# Patient Record
Sex: Male | Born: 1955 | Race: Black or African American | Hispanic: No | Smoking: Current every day smoker
Health system: Southern US, Community
[De-identification: ages and names within clinical notes are randomized; demographics above are authoritative.]

## PROBLEM LIST (undated history)

## (undated) DIAGNOSIS — J449 Chronic obstructive pulmonary disease, unspecified: Secondary | ICD-10-CM

## (undated) DIAGNOSIS — B1920 Unspecified viral hepatitis C without hepatic coma: Secondary | ICD-10-CM

## (undated) DIAGNOSIS — F1910 Other psychoactive substance abuse, uncomplicated: Secondary | ICD-10-CM

## (undated) HISTORY — DX: Chronic obstructive pulmonary disease, unspecified: J44.9

---

## 2006-11-30 ENCOUNTER — Emergency Department (HOSPITAL_COMMUNITY): Admission: EM | Admit: 2006-11-30 | Discharge: 2006-11-30 | Payer: Self-pay | Admitting: Emergency Medicine

## 2015-04-15 ENCOUNTER — Telehealth: Payer: Self-pay | Admitting: *Deleted

## 2015-04-15 NOTE — Telephone Encounter (Signed)
Left patient a voice mail to call the clinic regarding referral for Hep C. He needs a lab appt and then he will be scheduled with Dr. Luciana Axeomer in July. Wendall MolaJacqueline Cockerham

## 2015-05-05 ENCOUNTER — Other Ambulatory Visit: Payer: Self-pay

## 2015-05-26 ENCOUNTER — Other Ambulatory Visit: Payer: No Typology Code available for payment source

## 2015-05-26 DIAGNOSIS — B182 Chronic viral hepatitis C: Secondary | ICD-10-CM

## 2015-05-27 LAB — HEPATITIS B SURFACE ANTIGEN: HEP B S AG: NEGATIVE

## 2015-05-27 LAB — HIV ANTIBODY (ROUTINE TESTING W REFLEX): HIV: NONREACTIVE

## 2015-05-27 LAB — PROTIME-INR
INR: 0.99 (ref <1.50)
PROTHROMBIN TIME: 13.1 s (ref 11.6–15.2)

## 2015-05-27 LAB — HEPATITIS B SURFACE ANTIBODY,QUALITATIVE: HEP B S AB: NEGATIVE

## 2015-06-02 LAB — HEPATITIS C RNA QUANTITATIVE
HCV QUANT LOG: 5.74 {Log} — AB (ref <1.18)
HCV QUANT: 544451 [IU]/mL — AB (ref <15)

## 2015-06-05 LAB — HEPATITIS C GENOTYPE

## 2015-06-30 ENCOUNTER — Ambulatory Visit (INDEPENDENT_AMBULATORY_CARE_PROVIDER_SITE_OTHER): Payer: No Typology Code available for payment source | Admitting: Internal Medicine

## 2015-06-30 ENCOUNTER — Encounter: Payer: Self-pay | Admitting: Internal Medicine

## 2015-06-30 VITALS — BP 122/81 | HR 66 | Temp 97.9°F | Ht 70.0 in | Wt 147.0 lb

## 2015-06-30 DIAGNOSIS — F1910 Other psychoactive substance abuse, uncomplicated: Secondary | ICD-10-CM

## 2015-06-30 DIAGNOSIS — B182 Chronic viral hepatitis C: Secondary | ICD-10-CM | POA: Insufficient documentation

## 2015-06-30 DIAGNOSIS — Z23 Encounter for immunization: Secondary | ICD-10-CM

## 2015-06-30 NOTE — Addendum Note (Signed)
Addended by: Wendall Mola A on: 06/30/2015 05:07 PM   Modules accepted: Orders

## 2015-06-30 NOTE — Progress Notes (Signed)
HPI:  +Stephen Lyons is a 59 y.o. male who presents for initial evaluation and management of chronic hepatitis C.  Patient tested positive tested this ;year during routine screening. Hepatitis C risk factors present are: IV drug abuse (details: remote, over 20 years ago), currently uses cocaine, does not inject. Patient denies history of blood transfusion, multiple sexual partners, renal dialysis, sexual contact with person with liver disease, tattoos. Patient has had other studies performed. Results: hepatitis C RNA by PCR, result: positive. Patient has not had prior treatment for Hepatitis C. Patient does not have a past history of liver disease. Patient does not have a family history of liver disease.  He does struggle with cocaine use and hopeful to get help to get off of drugs.  Does not drink alcohol.     Patient does not have documented immunity to Hepatitis A. Patient does not have documented immunity to Hepatitis B.    Review of Systems:  Constitutional: Negative for fatigue, weight loss.  HENT: Negative for hearing loss, ear pain, neck pain, tinnitus and ear discharge.  Eyes: Negative for icterus, discharge and redness.  Respiratory: Negative fordypsnea, wheezing.  Cardiovascular: Negative for chest pain, palpitations, orthopnea, claudication and leg swelling.  Gastrointestinal: Negative for nausea, vomiting, abdominal distention and abdominal pain.Negative for  Genitourinary: Negative for dysuria, urgency, frequency, hematuria and flank pain.  Musculoskeletal: Negative for arthralgias, arthritis Skin: Negative for itching and rash. Neurological: Negative for dizziness and weakness. Endo/Heme/Allergies: Negative for environmental allergies and polydipsia. Does not bruise/bleed easily.      No past medical history on file.  Prior to Admission medications   Not on File    Allergies  Allergen Reactions  . Aspirin Anaphylaxis    Social History  Substance Use Topics  .  Smoking status: Current Every Day Smoker -- 0.50 packs/day    Types: Cigarettes    Start date: 11/20/1968  . Smokeless tobacco: Never Used  . Alcohol Use: 0.0 oz/week    0 Standard drinks or equivalent per week     Comment: socially    FMHx: no liver disease, no liver cancer   Objective:   Filed Vitals:   06/30/15 1513  BP: 122/81  Pulse: 66  Temp: 97.9 F (36.6 C)   GEN: in no apparent distress and alert HEENT: anicteric Cardiac: Cor RRR and No murmurs Lungs: clear Abdomen: Bowel sounds are normal, liver is not enlarged, spleen is not enlarged Ext: peripheral pulses normal, no pedal edema, no clubbing or cyanosis Skin: negative for - jaundice, spider hemangioma, telangiectasia, palmar erythema, ecchymosis and atrophy Musculoskeletal: no joint swelling  Laboratory Genotype:  Lab Results  Component Value Date   HCVGENOTYPE 1a 05/26/2015   HCV viral load:  Lab Results  Component Value Date   HCVQUANT 829562* 05/26/2015   No results found for: WBC, HGB, HCT, MCV, PLT No results found for: CREATININE, BUN, NA, K, CL, CO2  Lab Results  Component Value Date   INR 0.99 05/26/2015      Assessment: Chronic Hepatitis C genotype 1a I discussed with the patient the natural history and progression of chronic hepatitis C infection including about 30% of people who develop cirrhosis of the liver and once cirrhosis is established there is a 2-7% risk per year of liver cancer and liver failure.    Plan: 1) Patient counseled extensively on limiting acetaminophen to no more than 2 grams daily, avoidance of alcohol. 2) Transmission discussed with patient including sexual transmission, sharing razors and toothbrush.  3) Will need referral to gastroenterology if concern for cirrhosis 4) Will need referral for substance abuse counseling: Yes.   5) Will prescribe Zepatier for 12 weeks once he completes substance abuse counseling and is drug free.  He will need UDS prior to  consideration.   6) Hepatitis A vaccine Yes.   7) Hepatitis B vaccine Yes.   8) Pneumovax vaccine if concern for cirrhosis 9) will follow up after elastography; will do NS5A testing once he has completed substance abuse counseling

## 2015-07-07 ENCOUNTER — Ambulatory Visit: Payer: No Typology Code available for payment source | Admitting: *Deleted

## 2015-07-29 ENCOUNTER — Other Ambulatory Visit: Payer: Self-pay | Admitting: Internal Medicine

## 2015-07-29 ENCOUNTER — Telehealth: Payer: Self-pay | Admitting: *Deleted

## 2015-07-29 ENCOUNTER — Ambulatory Visit (HOSPITAL_COMMUNITY)
Admission: RE | Admit: 2015-07-29 | Discharge: 2015-07-29 | Disposition: A | Discharge status: Home / Self Care | Payer: Self-pay | Source: Ambulatory Visit | Attending: Internal Medicine | Admitting: Internal Medicine

## 2015-07-29 DIAGNOSIS — R160 Hepatomegaly, not elsewhere classified: Secondary | ICD-10-CM

## 2015-07-29 DIAGNOSIS — B182 Chronic viral hepatitis C: Secondary | ICD-10-CM | POA: Insufficient documentation

## 2015-07-29 NOTE — Telephone Encounter (Signed)
Called and left patient a voice mail to call me back regarding his elastography results. Per Dr. Luciana Axe it is recommended that he have a follow up MRI. This has been scheduled for 08/05/15 at 4:00 pm and patient should have nothing to eat or drink for four hours prior to the test. If he has questions Dr. Luciana Axe is available this afternoon to speak to the patient. Wendall Mola

## 2015-07-29 NOTE — Telephone Encounter (Signed)
Left patient a voicemail. Please see detailed note.

## 2015-07-29 NOTE — Telephone Encounter (Signed)
Received call from Erskine Squibb in radiology regarding impression #1 from patient's elastography.  Futher workup with MRI is recommended. Andree Coss, RN

## 2015-08-05 ENCOUNTER — Ambulatory Visit (HOSPITAL_COMMUNITY)
Admission: RE | Admit: 2015-08-05 | Discharge: 2015-08-05 | Disposition: A | Discharge status: Home / Self Care | Payer: Self-pay | Source: Ambulatory Visit | Attending: Internal Medicine | Admitting: Internal Medicine

## 2015-08-05 DIAGNOSIS — K769 Liver disease, unspecified: Secondary | ICD-10-CM | POA: Insufficient documentation

## 2015-08-05 DIAGNOSIS — B1920 Unspecified viral hepatitis C without hepatic coma: Secondary | ICD-10-CM | POA: Insufficient documentation

## 2015-08-05 DIAGNOSIS — R160 Hepatomegaly, not elsewhere classified: Secondary | ICD-10-CM

## 2015-08-05 LAB — CREATININE, SERUM
CREATININE: 1.07 mg/dL (ref 0.61–1.24)
GFR calc Af Amer: >60 mL/min (ref >60)
GFR calc non Af Amer: >60 mL/min (ref >60)

## 2015-08-05 MED ORDER — GADOXETATE DISODIUM 0.25 MMOL/ML IV SOLN
7.0000 mL | Freq: Once | INTRAVENOUS | Status: AC | PRN
  Administered 2015-08-05: 7 mL via INTRAVENOUS

## 2015-08-17 ENCOUNTER — Encounter (HOSPITAL_COMMUNITY): Payer: Self-pay | Admitting: Emergency Medicine

## 2015-08-17 ENCOUNTER — Emergency Department (HOSPITAL_COMMUNITY): Payer: No Typology Code available for payment source

## 2015-08-17 ENCOUNTER — Emergency Department (HOSPITAL_COMMUNITY)
Admission: EM | Admit: 2015-08-17 | Discharge: 2015-08-17 | Disposition: A | Discharge status: Home / Self Care | Payer: Self-pay | Attending: Emergency Medicine | Admitting: Emergency Medicine

## 2015-08-17 DIAGNOSIS — Z8619 Personal history of other infectious and parasitic diseases: Secondary | ICD-10-CM | POA: Insufficient documentation

## 2015-08-17 DIAGNOSIS — T8182XA Emphysema (subcutaneous) resulting from a procedure, initial encounter: Secondary | ICD-10-CM | POA: Insufficient documentation

## 2015-08-17 DIAGNOSIS — Y658 Other specified misadventures during surgical and medical care: Secondary | ICD-10-CM | POA: Insufficient documentation

## 2015-08-17 DIAGNOSIS — J069 Acute upper respiratory infection, unspecified: Secondary | ICD-10-CM | POA: Insufficient documentation

## 2015-08-17 DIAGNOSIS — Z720 Tobacco use: Secondary | ICD-10-CM | POA: Insufficient documentation

## 2015-08-17 HISTORY — DX: Unspecified viral hepatitis C without hepatic coma: B19.20

## 2015-08-17 LAB — CBC WITH DIFFERENTIAL/PLATELET
Basophils Absolute: 0 10*3/uL (ref 0.0–0.1)
Basophils Relative: 0 %
Eosinophils Absolute: 0.2 10*3/uL (ref 0.0–0.7)
Eosinophils Relative: 2 %
HCT: 47.1 % (ref 39.0–52.0)
Hemoglobin: 15.9 g/dL (ref 13.0–17.0)
Lymphocytes Relative: 10 %
Lymphs Abs: 1.1 10*3/uL (ref 0.7–4.0)
MCH: 31.9 pg (ref 26.0–34.0)
MCHC: 33.8 g/dL (ref 30.0–36.0)
MCV: 94.4 fL (ref 78.0–100.0)
Monocytes Absolute: 0.5 10*3/uL (ref 0.1–1.0)
Monocytes Relative: 5 %
Neutro Abs: 8.8 10*3/uL — ABNORMAL HIGH (ref 1.7–7.7)
Neutrophils Relative %: 83 %
Platelets: 236 10*3/uL (ref 150–400)
RBC: 4.99 MIL/uL (ref 4.22–5.81)
RDW: 12.8 % (ref 11.5–15.5)
WBC: 10.5 10*3/uL (ref 4.0–10.5)

## 2015-08-17 LAB — COMPREHENSIVE METABOLIC PANEL
ALT: 26 U/L (ref 17–63)
AST: 25 U/L (ref 15–41)
Albumin: 3.8 g/dL (ref 3.5–5.0)
Alkaline Phosphatase: 81 U/L (ref 38–126)
Anion gap: 8 (ref 5–15)
BUN: 8 mg/dL (ref 6–20)
CO2: 26 mmol/L (ref 22–32)
Calcium: 8.9 mg/dL (ref 8.9–10.3)
Chloride: 102 mmol/L (ref 101–111)
Creatinine, Ser: 0.95 mg/dL (ref 0.61–1.24)
GFR calc Af Amer: >60 mL/min (ref >60)
GFR calc non Af Amer: >60 mL/min (ref >60)
Glucose, Bld: 79 mg/dL (ref 65–99)
Potassium: 4 mmol/L (ref 3.5–5.1)
Sodium: 136 mmol/L (ref 135–145)
Total Bilirubin: 0.6 mg/dL (ref 0.3–1.2)
Total Protein: 6.7 g/dL (ref 6.5–8.1)

## 2015-08-17 MED ORDER — IPRATROPIUM BROMIDE 0.02 % IN SOLN
0.5000 mg | RESPIRATORY_TRACT | Status: AC
  Administered 2015-08-17: 0.5 mg via RESPIRATORY_TRACT
  Filled 2015-08-17: qty 2.5

## 2015-08-17 MED ORDER — ALBUTEROL (5 MG/ML) CONTINUOUS INHALATION SOLN
10.0000 mg/h | INHALATION_SOLUTION | RESPIRATORY_TRACT | Status: AC
  Administered 2015-08-17: 10 mg/h via RESPIRATORY_TRACT
  Filled 2015-08-17: qty 20

## 2015-08-17 MED ORDER — ALBUTEROL SULFATE HFA 108 (90 BASE) MCG/ACT IN AERS
2.0000 | INHALATION_SPRAY | RESPIRATORY_TRACT | Status: DC | PRN
  Administered 2015-08-17: 2 via RESPIRATORY_TRACT
  Filled 2015-08-17: qty 6.7

## 2015-08-17 MED ORDER — GUAIFENESIN ER 1200 MG PO TB12: 1.0000 | ORAL_TABLET | Freq: Two times a day (BID) | ORAL | Status: DC

## 2015-08-17 MED ORDER — DOXYCYCLINE HYCLATE 100 MG PO CAPS: 100.0000 mg | ORAL_CAPSULE | Freq: Two times a day (BID) | ORAL | Status: DC

## 2015-08-17 MED ORDER — PREDNISONE 50 MG PO TABS: 50.0000 mg | ORAL_TABLET | Freq: Every day | ORAL | Status: DC

## 2015-08-17 MED ORDER — AEROCHAMBER PLUS FLO-VU MEDIUM MISC
1.0000 | Freq: Once | Status: AC
  Administered 2015-08-17: 1

## 2015-08-17 NOTE — Discharge Instructions (Signed)
Return here as needed.  Followup with your primary care Dr. increase your fluid intake °

## 2015-08-17 NOTE — ED Provider Notes (Signed)
CSN: [id number]     Arrival date & time 08/17/15  0865 History   First MD Initiated Contact with Patient 08/17/15 [phone]     Chief Complaint  Patient presents with  . Cough  . Shortness of Breath     (Consider location/radiation/quality/duration/timing/severity/associated sxs/prior Treatment) HPI Patient presents to the emergency department with cough and shortness of breath over the last 3 weeks.  Patient states that he has been taking an antibiotic that a friend gave him.  Patient states that he has had clear mucous production.  He states that he currently does smoke half pack a day and has done so for the last 45 years.  Patient states that he does not have any nausea, vomiting, abdominal pain, fever, headache, blurred vision, weakness, dizziness, neck pain, dysuria, incontinence, or syncope.  Patient states that exertion seems to make his wheezing worse Past Medical History  Diagnosis Date  . Hepatitis C    No past surgical history on file. No family history on file. Social History  Substance Use Topics  . Smoking status: Current Every Day Smoker -- 0.50 packs/day    Types: Cigarettes    Start date: 11/20/1968  . Smokeless tobacco: Never Used  . Alcohol Use: 0.0 oz/week    0 Standard drinks or equivalent per week     Comment: socially    Review of Systems All other systems negative except as documented in the HPI. All pertinent positives and negatives as reviewed in the HPI.    Allergies  Aspirin  Home Medications   Prior to Admission medications   Medication Sig Start Date End Date Taking? Authorizing Josip Merolla  penicillin v potassium (VEETID) 500 MG tablet Take 500 mg by mouth 3 (three) times daily.   Yes Historical Latoyia Tecson, MD   BP 109/76 mmHg  Pulse 98  Temp(Src) 98.4 F (36.9 C) (Oral)  Resp 18  Ht  (1.778 m)  Wt 155 lb (70.308 kg)  BMI 22.24 kg/m2  SpO2 98% Physical Exam  Constitutional: He is oriented to person, place, and time. He appears  well-developed and well-nourished. No distress.  HENT:  Head: Normocephalic and atraumatic.  Mouth/Throat: Oropharynx is clear and moist.  Eyes: Pupils are equal, round, and reactive to light.  Neck: Normal range of motion. Neck supple.  Cardiovascular: Normal rate, regular rhythm and normal heart sounds.  Exam reveals no gallop and no friction rub.   No murmur heard. Pulmonary/Chest: Effort normal. No respiratory distress. He has wheezes. He has no rales.  Neurological: He is alert and oriented to person, place, and time. No cranial nerve deficit. He exhibits normal muscle tone. Coordination normal.  Skin: Skin is warm and dry. No erythema.  Psychiatric: He has a normal mood and affect. His behavior is normal.  Nursing note and vitals reviewed.   ED Course  Procedures (including critical care time) Labs Review Labs Reviewed  CBC WITH DIFFERENTIAL/PLATELET - Abnormal; Notable for the following:    Neutro Abs 8.8 (*)    All other components within normal limits  COMPREHENSIVE METABOLIC PANEL    Imaging Review Dg Chest 2 View  08/17/2015   CLINICAL DATA:  Productive cough for 3 weeks.  Smoker.  EXAM: CHEST  2 VIEW  COMPARISON:  None.  FINDINGS: The chest is hyperexpanded with attenuation of the pulmonary vasculature. No consolidative process, pneumothorax or effusion is identified. Heart size is normal. No focal bony abnormality.  IMPRESSION: No acute disease.  Emphysema.   Electronically Signed  By: Drusilla Kanner M.D.   On: 08/17/2015 11:30   I have personally reviewed and evaluated these images and lab results as part of my medical decision-making.    Patient is feeling dramatically better.  His wheezing is vastly improved following an hour-long and treatments during the patient is advised to return here as needed.  He was also placed doxycycline as he was likely has COPD and emphysema.  The patient agrees to the plan and all questions were answered  Charlestine Night,  PA-C 08/24/15 1423  Loren Racer, MD 09/02/15 2258

## 2015-08-17 NOTE — ED Notes (Signed)
59 yom presents to ED with c/o cough and SOB at rest. Onset 3 weeks ago. Patient states he has been taking antibiotics (Penicillin) for 1 week that he got from his "nurse friend" who thought he had pneumonia. Patient states he is coughing up clear secretions and that his cough is worse at night. Denies pain but states his chest is full. Patient is a current every day smoker who smokes 1/2 pack a day and has been smoking for 45 years. Patient states he was dx with Hep C in July but has not started any treatment yet.

## 2015-08-17 NOTE — ED Notes (Signed)
Transported to xray 

## 2015-08-17 NOTE — ED Notes (Signed)
Patient requesting not to have any visitors. Psychiatric nurse notified.

## 2016-05-31 IMAGING — DX DG CHEST 2V
2 series · 2 of 2 positions shown · non-contrast
Comparison: None.

CLINICAL DATA: Productive cough for 3 weeks.  Smoker.

EXAM:
CHEST  2 VIEW

[chest pa]
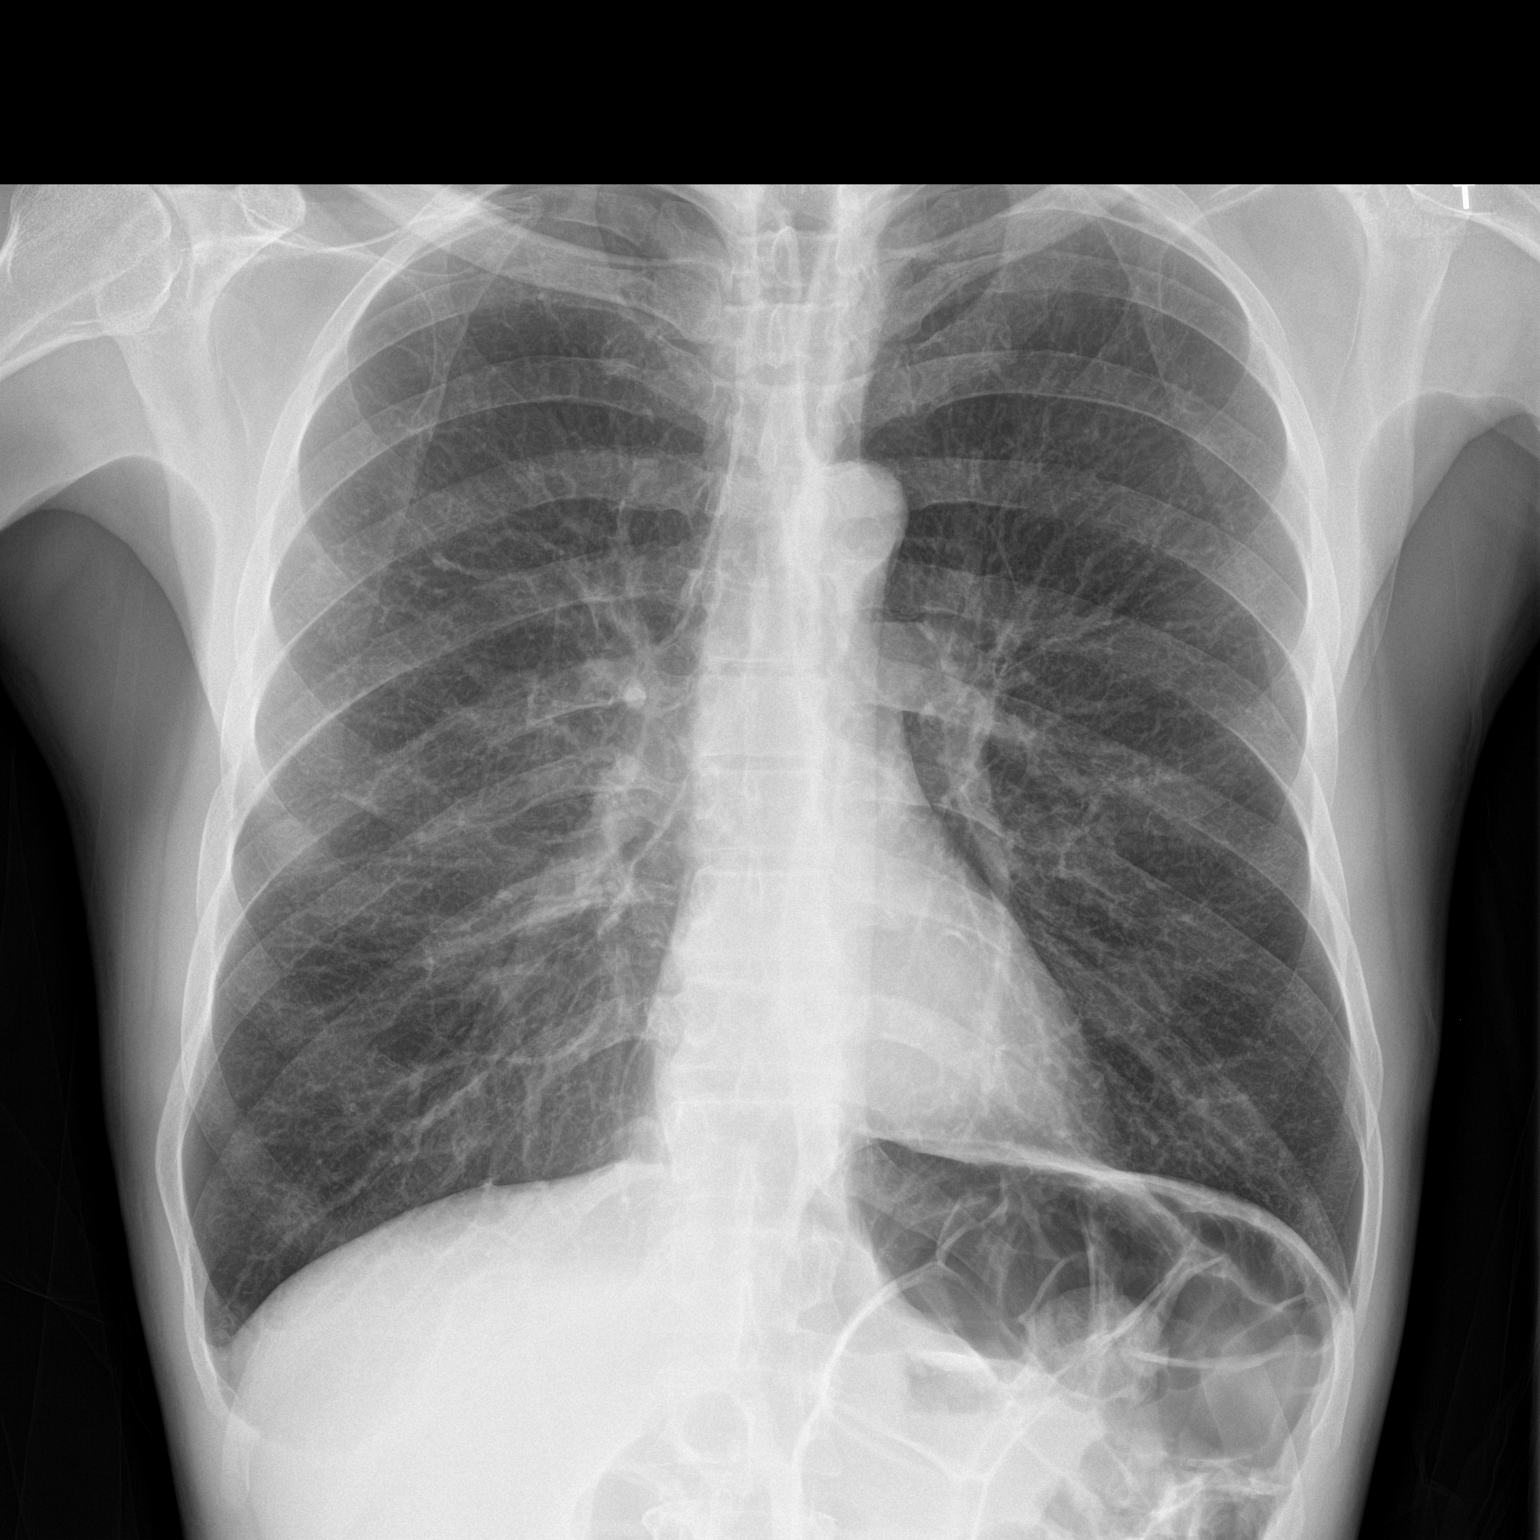

[chest lat]
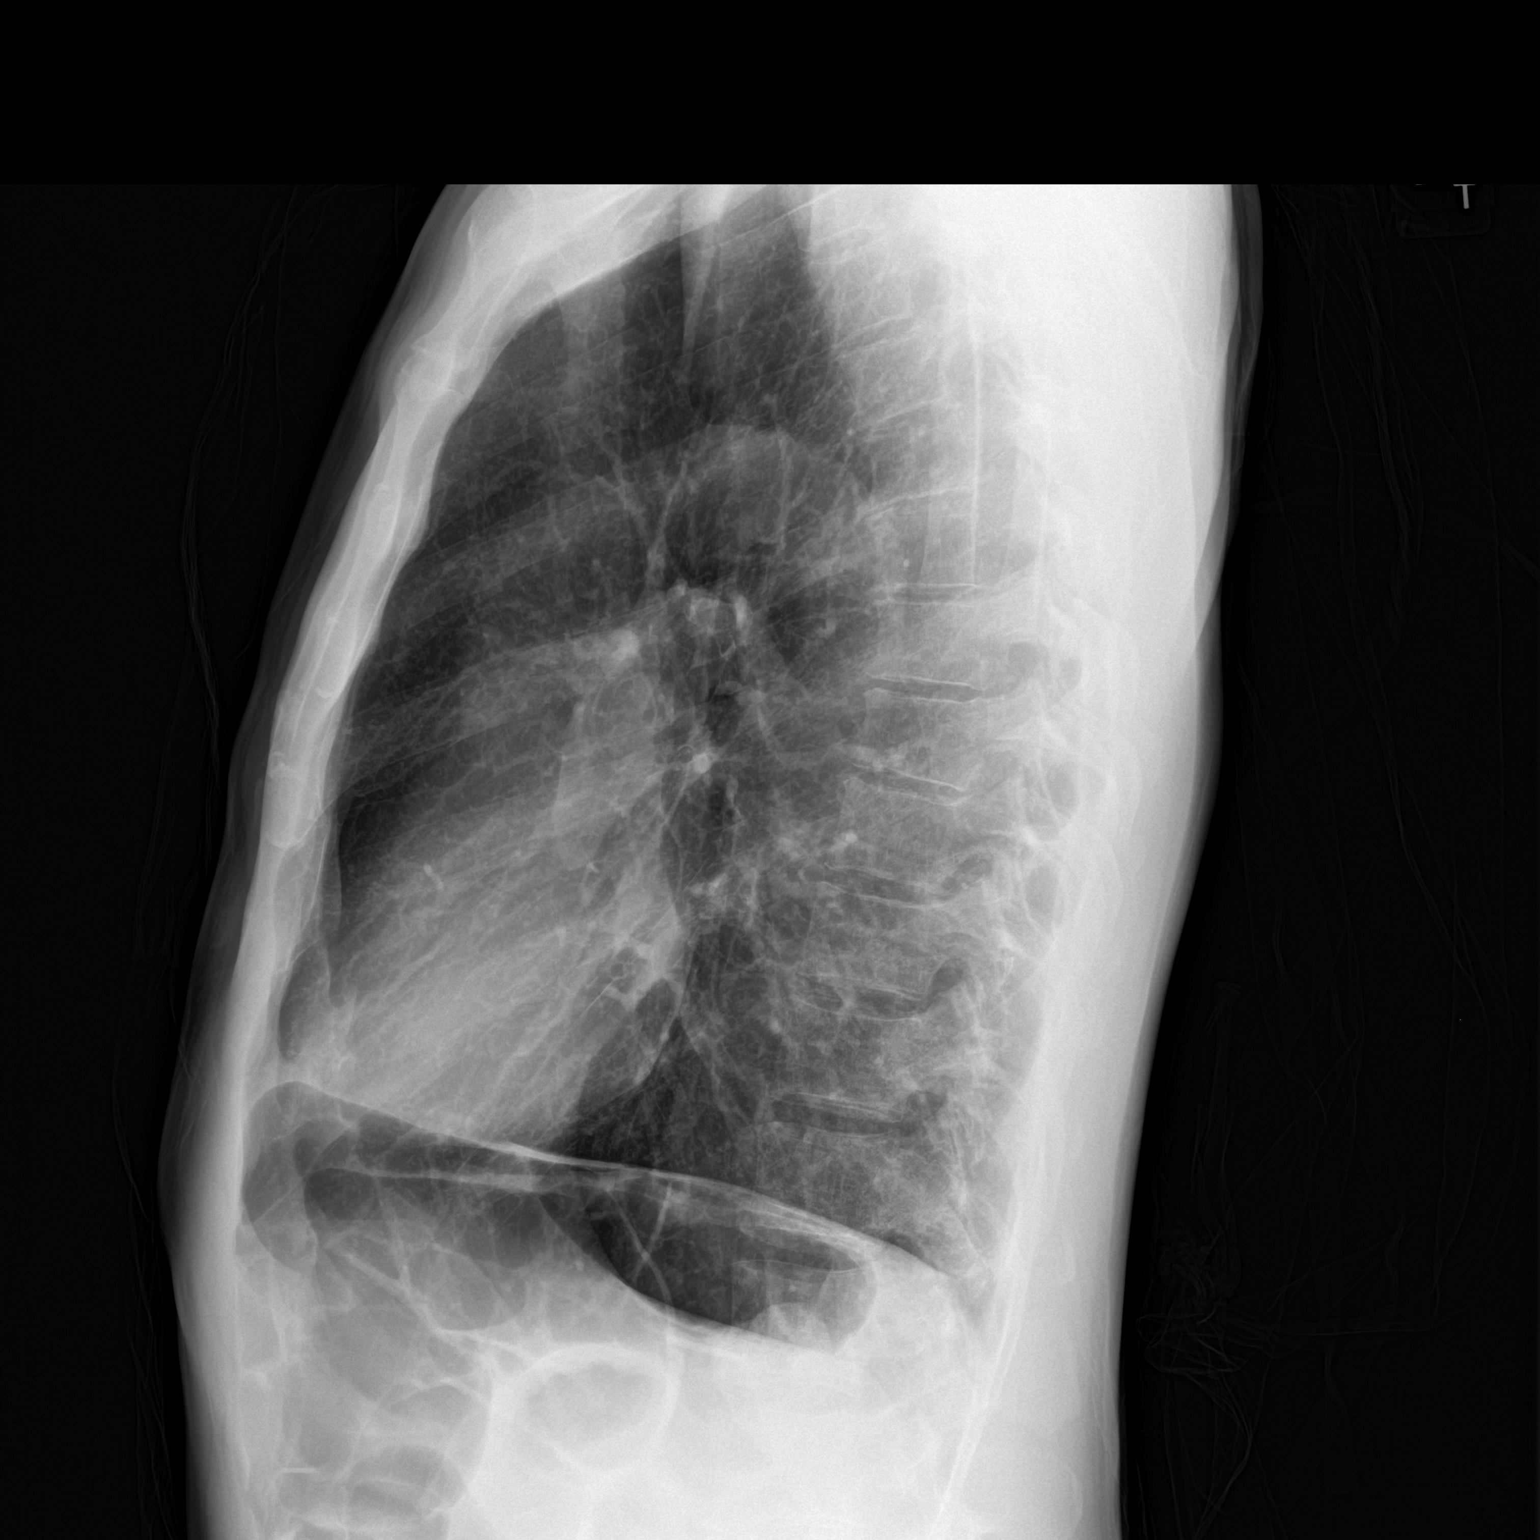

[2 of 2 positions shown; findings below may reference images not displayed]

FINDINGS: The chest is hyperexpanded with attenuation of the pulmonary
vasculature. No consolidative process, pneumothorax or effusion is
identified. Heart size is normal. No focal bony abnormality.
IMPRESSION: No acute disease.

Emphysema.

## 2016-09-18 ENCOUNTER — Encounter (HOSPITAL_COMMUNITY): Payer: Self-pay | Admitting: Emergency Medicine

## 2016-09-18 DIAGNOSIS — F17210 Nicotine dependence, cigarettes, uncomplicated: Secondary | ICD-10-CM | POA: Insufficient documentation

## 2016-09-18 DIAGNOSIS — N452 Orchitis: Secondary | ICD-10-CM | POA: Insufficient documentation

## 2016-09-18 DIAGNOSIS — N410 Acute prostatitis: Secondary | ICD-10-CM | POA: Insufficient documentation

## 2016-09-18 DIAGNOSIS — N451 Epididymitis: Secondary | ICD-10-CM | POA: Insufficient documentation

## 2016-09-18 LAB — URINALYSIS, ROUTINE W REFLEX MICROSCOPIC
BILIRUBIN URINE: NEGATIVE
Glucose, UA: NEGATIVE mg/dL
KETONES UR: NEGATIVE mg/dL
NITRITE: NEGATIVE
PROTEIN: NEGATIVE mg/dL
Specific Gravity, Urine: 1.025 (ref 1.005–1.030)
pH: 6 (ref 5.0–8.0)

## 2016-09-18 LAB — CBC WITH DIFFERENTIAL/PLATELET
BASOS PCT: 0 %
Basophils Absolute: 0 10*3/uL (ref 0.0–0.1)
EOS ABS: 0.1 10*3/uL (ref 0.0–0.7)
EOS PCT: 0 %
HCT: 44.5 % (ref 39.0–52.0)
Hemoglobin: 14.9 g/dL (ref 13.0–17.0)
LYMPHS ABS: 1.8 10*3/uL (ref 0.7–4.0)
Lymphocytes Relative: 7 %
MCH: 31.6 pg (ref 26.0–34.0)
MCHC: 33.5 g/dL (ref 30.0–36.0)
MCV: 94.5 fL (ref 78.0–100.0)
Monocytes Absolute: 1.2 10*3/uL — ABNORMAL HIGH (ref 0.1–1.0)
Monocytes Relative: 5 %
NEUTROS PCT: 88 %
Neutro Abs: 21 10*3/uL — ABNORMAL HIGH (ref 1.7–7.7)
PLATELETS: 345 10*3/uL (ref 150–400)
RBC: 4.71 MIL/uL (ref 4.22–5.81)
RDW: 13 % (ref 11.5–15.5)
WBC: 24 10*3/uL — AB (ref 4.0–10.5)

## 2016-09-18 LAB — URINE MICROSCOPIC-ADD ON
Bacteria, UA: NONE SEEN
Squamous Epithelial / LPF: NONE SEEN

## 2016-09-18 LAB — COMPREHENSIVE METABOLIC PANEL
ALBUMIN: 3.4 g/dL — AB (ref 3.5–5.0)
ALT: 17 U/L (ref 17–63)
ANION GAP: 6 (ref 5–15)
AST: 20 U/L (ref 15–41)
Alkaline Phosphatase: 76 U/L (ref 38–126)
BUN: 8 mg/dL (ref 6–20)
CHLORIDE: 104 mmol/L (ref 101–111)
CO2: 27 mmol/L (ref 22–32)
CREATININE: 0.79 mg/dL (ref 0.61–1.24)
Calcium: 8.7 mg/dL — ABNORMAL LOW (ref 8.9–10.3)
GFR calc non Af Amer: >60 mL/min (ref >60)
GLUCOSE: 99 mg/dL (ref 65–99)
Potassium: 3.5 mmol/L (ref 3.5–5.1)
SODIUM: 137 mmol/L (ref 135–145)
Total Bilirubin: 0.5 mg/dL (ref 0.3–1.2)
Total Protein: 6.9 g/dL (ref 6.5–8.1)

## 2016-09-18 NOTE — ED Triage Notes (Signed)
Pt. reports yellowish penile discharge with right groin/right testicular pain onset last week , pt. added blood in semen.

## 2016-09-19 ENCOUNTER — Emergency Department (HOSPITAL_COMMUNITY): Payer: No Typology Code available for payment source

## 2016-09-19 ENCOUNTER — Emergency Department (HOSPITAL_COMMUNITY)
Admission: EM | Admit: 2016-09-19 | Discharge: 2016-09-19 | Disposition: A | Discharge status: Home / Self Care | Payer: No Typology Code available for payment source | Attending: Emergency Medicine | Admitting: Emergency Medicine

## 2016-09-19 DIAGNOSIS — N50811 Right testicular pain: Secondary | ICD-10-CM

## 2016-09-19 DIAGNOSIS — N410 Acute prostatitis: Secondary | ICD-10-CM

## 2016-09-19 DIAGNOSIS — N453 Epididymo-orchitis: Secondary | ICD-10-CM

## 2016-09-19 LAB — GC/CHLAMYDIA PROBE AMP (~~LOC~~) NOT AT ARMC
CHLAMYDIA, DNA PROBE: NEGATIVE
NEISSERIA GONORRHEA: NEGATIVE

## 2016-09-19 MED ORDER — AZITHROMYCIN 250 MG PO TABS
1000.0000 mg | ORAL_TABLET | Freq: Once | ORAL | Status: AC
  Administered 2016-09-19: 1000 mg via ORAL
  Filled 2016-09-19: qty 4

## 2016-09-19 MED ORDER — CEFTRIAXONE SODIUM 250 MG IJ SOLR
250.0000 mg | Freq: Once | INTRAMUSCULAR | Status: AC
  Administered 2016-09-19: 250 mg via INTRAMUSCULAR
  Filled 2016-09-19: qty 250

## 2016-09-19 MED ORDER — LIDOCAINE HCL (PF) 1 % IJ SOLN
INTRAMUSCULAR | Status: AC
  Administered 2016-09-19: 1 mL
  Filled 2016-09-19: qty 5

## 2016-09-19 MED ORDER — CIPROFLOXACIN HCL 500 MG PO TABS: 500.0000 mg | ORAL_TABLET | Freq: Two times a day (BID) | ORAL | 0 refills | Status: DC

## 2016-09-19 MED ORDER — NAPROXEN 500 MG PO TABS: 500.0000 mg | ORAL_TABLET | Freq: Two times a day (BID) | ORAL | 0 refills | Status: DC

## 2016-09-19 NOTE — Discharge Instructions (Signed)
You were seen today for testicle pain as well as bloody semen. You likely have both orchitis and prostatitis. You are also treated for an tested for STDs. You would be called if this is positive. If you develop fevers or any new or worsening symptoms she needs to be reevaluated.

## 2016-09-19 NOTE — ED Provider Notes (Signed)
[facility]-EMERGENCY DEPT Provider Note   CSN: [id number] Arrival date & time: 09/18/16  1945  By signing my name below, I, Clovis Pu, attest that this documentation has been prepared under the direction and in the presence of Shon Baton, MD  Electronically Signed: Clovis Pu, ED Scribe. 09/19/16. 1:41 AM.  History   Chief Complaint Chief Complaint  Patient presents with  . Penile Discharge  . Testicle Pain   The history is provided by the patient. No language interpreter was used.   HPI Comments:  Stephen Lyons is a 60 y.o. male who presents to the Emergency Department complaining of "2/10" R testicle pain x 4-5 days. Pain is exacerbated to the touch. He notes associated penile discharge with some blood. Also reports bloody ejaculation. Pt denies penile pain, fevers, back pain, abdominal pain or fevers. He also denies any new sexual contacts or a chance of STD. No alleviating factors noted. Pt denies any other symptoms or complaints at this time.  Past Medical History:  Diagnosis Date  . Hepatitis C     Patient Active Problem List   Diagnosis Date Noted  . Liver mass 07/29/2015  . Chronic hepatitis C without hepatic coma (HCC) 06/30/2015  . Substance abuse 06/30/2015    No past surgical history on file.     Home Medications    Prior to Admission medications   Medication Sig Start Date End Date Taking? Authorizing Provider  acetaminophen (TYLENOL) 500 MG tablet Take 1,000 mg by mouth every 6 (six) hours as needed for mild pain.   Yes Historical Provider, MD  ciprofloxacin (CIPRO) 500 MG tablet Take 1 tablet (500 mg total) by mouth every 12 (twelve) hours. 09/19/16   Shon Baton, MD  naproxen (NAPROSYN) 500 MG tablet Take 1 tablet (500 mg total) by mouth 2 (two) times daily. Limit use to 5 days 09/19/16   Shon Baton, MD    Family History No family history on file.  Social History Social History  Substance Use Topics  . Smoking status: Current  Every Day Smoker    Packs/day: 0.00    Types: Cigarettes    Start date: 11/20/1968  . Smokeless tobacco: Never Used  . Alcohol use 0.0 oz/week     Comment: socially     Allergies   Aspirin   Review of Systems Review of Systems  Constitutional: Negative for fever.  Gastrointestinal: Negative for abdominal pain.  Genitourinary: Positive for discharge and testicular pain. Negative for difficulty urinating, dysuria and penile pain.  Musculoskeletal: Negative for back pain.  All other systems reviewed and are negative.    Physical Exam Updated Vital Signs BP 125/85   Pulse 83   Temp 98.3 F (36.8 C)   Resp 17   Ht 5\' 10"  (1.778 m)   Wt 145 lb (65.8 kg)   SpO2 97%   BMI 20.81 kg/m   Physical Exam  Constitutional: He is oriented to person, place, and time. He appears well-developed and well-nourished. No distress.  HENT:  Head: Normocephalic and atraumatic.  Poor dentition  Cardiovascular: Normal rate, regular rhythm and normal heart sounds.   No murmur heard. Pulmonary/Chest: Effort normal and breath sounds normal. No respiratory distress. He has no wheezes.  Abdominal: Soft. Bowel sounds are normal. There is no tenderness. There is no rebound.  Genitourinary:  Genitourinary Comments: Uncircumcised penis, no lesions noted, tenderness to palpation of right testicle, enlarged, no masses noted, no crepitus or overlying skin changes, no inguinal masses or  lymphadenopathy, no discharge noted  Musculoskeletal: He exhibits no edema.  Neurological: He is alert and oriented to person, place, and time.  Skin: Skin is warm and dry.  Psychiatric: He has a normal mood and affect.  Nursing note and vitals reviewed.    ED Treatments / Results  DIAGNOSTIC STUDIES:  Oxygen Saturation is 99% on RA, normal by my interpretation.    COORDINATION OF CARE:  1:24 AM Discussed treatment plan with pt at bedside and pt agreed to plan.  Labs (all labs ordered are listed, but only  abnormal results are displayed) Labs Reviewed  URINALYSIS, ROUTINE W REFLEX MICROSCOPIC (NOT [facility]) - Abnormal; Notable for the following:       Result Value   APPearance CLOUDY (*)    Hgb urine dipstick SMALL (*)    Leukocytes, UA MODERATE (*)    All other components within normal limits  CBC WITH DIFFERENTIAL/PLATELET - Abnormal; Notable for the following:    WBC 24.0 (*)    Neutro Abs 21.0 (*)    Monocytes Absolute 1.2 (*)    All other components within normal limits  COMPREHENSIVE METABOLIC PANEL - Abnormal; Notable for the following:    Calcium 8.7 (*)    Albumin 3.4 (*)    All other components within normal limits  URINE MICROSCOPIC-ADD ON  GC/CHLAMYDIA PROBE AMP ([place]) NOT AT [facility]    EKG  EKG Interpretation None       Radiology [place] Scrotum  Result Date: 09/19/2016 CLINICAL DATA:  Acute onset of right testicular pain. Initial encounter. EXAM: SCROTAL ULTRASOUND DOPPLER ULTRASOUND OF THE TESTICLES TECHNIQUE: Complete ultrasound examination of the testicles, epididymis, and other scrotal structures was performed. Color and spectral Doppler ultrasound were also utilized to evaluate blood flow to the testicles. COMPARISON:  None. FINDINGS: Right testicle Measurements: 3.8 x 2.9 x 3.1 cm. No mass or microlithiasis visualized. There is mild heterogeneity of right testicular echogenicity. Left testicle Measurements: 4.2 x 2.7 x 3.8 cm. No mass or microlithiasis visualized. Right epididymis: Diffusely enlarged and hyperemic, with a complex 0.9 cm cyst at the epididymal tail. Left epididymis:  Normal in size and appearance. Hydrocele:  None visualized. Varicocele:  None visualized. Pulsed Doppler interrogation of both testes demonstrates normal low resistance arterial and venous waveforms bilaterally, with significant hyperemia at the right testis. IMPRESSION: 1. No evidence of testicular torsion. 2. Significant hyperemia at the right testis and right epididymis, with enlargement  of the right epididymis, compatible with right-sided orchitis and epididymitis. 3. Complex 0.9 cm cyst noted at the right epididymal tail. Electronically Signed   By: Roanna RaiderJeffery  Chang M.D.   On: 09/19/2016 02:26   [place] Art/ven Flow Abd Pelv Doppler  Result Date: 09/19/2016 CLINICAL DATA:  Acute onset of right testicular pain. Initial encounter. EXAM: SCROTAL ULTRASOUND DOPPLER ULTRASOUND OF THE TESTICLES TECHNIQUE: Complete ultrasound examination of the testicles, epididymis, and other scrotal structures was performed. Color and spectral Doppler ultrasound were also utilized to evaluate blood flow to the testicles. COMPARISON:  None. FINDINGS: Right testicle Measurements: 3.8 x 2.9 x 3.1 cm. No mass or microlithiasis visualized. There is mild heterogeneity of right testicular echogenicity. Left testicle Measurements: 4.2 x 2.7 x 3.8 cm. No mass or microlithiasis visualized. Right epididymis: Diffusely enlarged and hyperemic, with a complex 0.9 cm cyst at the epididymal tail. Left epididymis:  Normal in size and appearance. Hydrocele:  None visualized. Varicocele:  None visualized. Pulsed Doppler interrogation of both testes demonstrates normal low resistance arterial and venous  waveforms bilaterally, with significant hyperemia at the right testis. IMPRESSION: 1. No evidence of testicular torsion. 2. Significant hyperemia at the right testis and right epididymis, with enlargement of the right epididymis, compatible with right-sided orchitis and epididymitis. 3. Complex 0.9 cm cyst noted at the right epididymal tail. Electronically Signed   By: Roanna RaiderJeffery  Chang M.D.   On: 09/19/2016 02:26    Procedures Procedures (including critical care time)  Medications Ordered in ED Medications  cefTRIAXone (ROCEPHIN) injection 250 mg (250 mg Intramuscular Given 09/19/16 0318)  azithromycin (ZITHROMAX) tablet 1,000 mg (1,000 mg Oral Given 09/19/16 0317)  lidocaine (PF) (XYLOCAINE) 1 % injection (1 mL  Given 09/19/16 0318)      Initial Impression / Assessment and Plan / ED Course  I have reviewed the triage vital signs and the nursing notes.  Pertinent labs & imaging results that were available during my care of the patient were reviewed by me and considered in my medical decision making (see chart for details).  Clinical Course    Patient presents with penile discharge and bloody semen. Denies infectious symptoms. Nontoxic on exam. He has a tender enlarged right testicle. No signs or symptoms of 4 days. Patient was tested and treated for STDs. Ultrasound of the right testicle is negative for torsion but do show evidence of orchitis and epididymitis. Suspect clinically he also has prostatitis. Lab work is notable for too numerous to count white cells in his urine. Culture sent.  White count is 24. He is nontoxic-appearing. No signs or symptoms of sepsis. No back pain indicative of pollen of nephritis. Suspect prostatitis. Will discharge on Cipro for 1 week. He was given strict return precautions.  After history, exam, and medical workup I feel the patient has been appropriately medically screened and is safe for discharge home. Pertinent diagnoses were discussed with the patient. Patient was given return precautions.   Final Clinical Impressions(s) / ED Diagnoses   Final diagnoses:  Right testicular pain  Orchitis and epididymitis  Acute prostatitis    New Prescriptions New Prescriptions   CIPROFLOXACIN (CIPRO) 500 MG TABLET    Take 1 tablet (500 mg total) by mouth every 12 (twelve) hours.   NAPROXEN (NAPROSYN) 500 MG TABLET    Take 1 tablet (500 mg total) by mouth 2 (two) times daily. Limit use to 5 days   I personally performed the services described in this documentation, which was scribed in my presence. The recorded information has been reviewed and is accurate.     Shon Batonourtney F Niguel Moure, MD 09/19/16 [phone]

## 2016-09-20 LAB — URINE CULTURE: CULTURE: NO GROWTH

## 2017-08-17 ENCOUNTER — Emergency Department (HOSPITAL_COMMUNITY)
Admission: EM | Admit: 2017-08-17 | Discharge: 2017-08-17 | Disposition: A | Discharge status: Home / Self Care | Payer: No Typology Code available for payment source | Attending: Emergency Medicine | Admitting: Emergency Medicine

## 2017-08-17 ENCOUNTER — Encounter (HOSPITAL_COMMUNITY): Payer: Self-pay | Admitting: Neurology

## 2017-08-17 ENCOUNTER — Emergency Department (HOSPITAL_COMMUNITY): Payer: No Typology Code available for payment source

## 2017-08-17 DIAGNOSIS — F17210 Nicotine dependence, cigarettes, uncomplicated: Secondary | ICD-10-CM | POA: Insufficient documentation

## 2017-08-17 DIAGNOSIS — Z79899 Other long term (current) drug therapy: Secondary | ICD-10-CM | POA: Insufficient documentation

## 2017-08-17 DIAGNOSIS — J189 Pneumonia, unspecified organism: Secondary | ICD-10-CM | POA: Insufficient documentation

## 2017-08-17 LAB — CBC
HCT: 48.8 % (ref 39.0–52.0)
HEMOGLOBIN: 16.7 g/dL (ref 13.0–17.0)
MCH: 32.6 pg (ref 26.0–34.0)
MCHC: 34.2 g/dL (ref 30.0–36.0)
MCV: 95.1 fL (ref 78.0–100.0)
PLATELETS: 267 10*3/uL (ref 150–400)
RBC: 5.13 MIL/uL (ref 4.22–5.81)
RDW: 13.3 % (ref 11.5–15.5)
WBC: 26.5 10*3/uL — ABNORMAL HIGH (ref 4.0–10.5)

## 2017-08-17 LAB — BASIC METABOLIC PANEL
Anion gap: 8 (ref 5–15)
BUN: 8 mg/dL (ref 6–20)
CO2: 27 mmol/L (ref 22–32)
Calcium: 9 mg/dL (ref 8.9–10.3)
Chloride: 100 mmol/L — ABNORMAL LOW (ref 101–111)
Creatinine, Ser: 1.08 mg/dL (ref 0.61–1.24)
GFR calc Af Amer: >60 mL/min (ref >60)
Glucose, Bld: 119 mg/dL — ABNORMAL HIGH (ref 65–99)
POTASSIUM: 4.3 mmol/L (ref 3.5–5.1)
Sodium: 135 mmol/L (ref 135–145)

## 2017-08-17 MED ORDER — LEVOFLOXACIN IN D5W 750 MG/150ML IV SOLN
750.0000 mg | Freq: Once | INTRAVENOUS | Status: AC
  Administered 2017-08-17: 750 mg via INTRAVENOUS
  Filled 2017-08-17 (×2): qty 150

## 2017-08-17 MED ORDER — LEVOFLOXACIN 500 MG PO TABS: 500.0000 mg | ORAL_TABLET | Freq: Every day | ORAL | 0 refills | Status: DC

## 2017-08-17 MED ORDER — SODIUM CHLORIDE 0.9 % IV BOLUS (SEPSIS)
1000.0000 mL | Freq: Once | INTRAVENOUS | Status: AC
  Administered 2017-08-17: 1000 mL via INTRAVENOUS

## 2017-08-17 NOTE — Discharge Instructions (Signed)
You have a pneumonia, infection of the lung. Start the antibiotics.  Please return to the ER if your symptoms worsen; you have worsening fevers, chills, shortness of breath, inability to keep any medications down. Otherwise see your outpatient doctor in 1 week.

## 2017-08-17 NOTE — ED Triage Notes (Signed)
Pt reports cough, x several weeks, productive clear sputum. Chills started last night. Reports no appetite, generalized weakness, tired all the time. Feels nauseated, no v/d. Is a x 4. Reports soreness all over. Wearing mask.

## 2017-08-17 NOTE — ED Notes (Signed)
Ambulated pt in the hallway pt ambulated well no complaints noted at this time o2 sats 96% HR-118

## 2017-08-17 NOTE — ED Provider Notes (Signed)
[facility]-EMERGENCY DEPT Provider Note   CSN: [id number] Arrival date & time: 08/17/17  1222     History   Chief Complaint Chief Complaint  Patient presents with  . Cough  . Chills    HPI Stephen Lyons is a 61 y.o. male.  HPI  Pt comes in with cc of cough, fevers, and chills. Pt started coughing 3 weeks ago. Overtime, he has noted fevers and now he is having chills - so he decided to come to the Er. The cough produces white phlegm. Pt has no chest pain and he denies any shortness of breath. Pt is feeling malaise and weak. Pt has no medical hx. He smoked 1/2 ppd.  Past Medical History:  Diagnosis Date  . Hepatitis C     Patient Active Problem List   Diagnosis Date Noted  . Liver mass 07/29/2015  . Chronic hepatitis C without hepatic coma (HCC) 06/30/2015  . Substance abuse 06/30/2015    History reviewed. No pertinent surgical history.     Home Medications    Prior to Admission medications   Medication Sig Start Date End Date Taking? Authorizing Provider  ciprofloxacin (CIPRO) 500 MG tablet Take 1 tablet (500 mg total) by mouth every 12 (twelve) hours. Patient not taking: Reported on 08/17/2017 09/19/16   Horton, Mayer Masker, MD  levofloxacin (LEVAQUIN) 500 MG tablet Take 1 tablet (500 mg total) by mouth daily. 08/18/17   Derwood Kaplan, MD  naproxen (NAPROSYN) 500 MG tablet Take 1 tablet (500 mg total) by mouth 2 (two) times daily. Limit use to 5 days Patient not taking: Reported on 08/17/2017 09/19/16   Horton, Mayer Masker, MD    Family History No family history on file.  Social History Social History  Substance Use Topics  . Smoking status: Current Every Day Smoker    Packs/day: 0.00    Types: Cigarettes    Start date: 11/20/1968  . Smokeless tobacco: Never Used  . Alcohol use 0.0 oz/week     Comment: socially     Allergies   Aspirin   Review of Systems Review of Systems  Constitutional: Positive for activity change and fever.  Respiratory: Negative  for shortness of breath.   Cardiovascular: Negative for chest pain.  Hematological: Does not bruise/bleed easily.  All other systems reviewed and are negative.    Physical Exam Updated Vital Signs BP 119/74   Pulse 92   Temp 99 F (37.2 C) (Oral)   Resp (!) 25   Ht  (1.803 m)   SpO2 95%   Physical Exam  Constitutional: He is oriented to person, place, and time. He appears well-developed.  HENT:  Head: Normocephalic and atraumatic.  Eyes: Pupils are equal, round, and reactive to light. Conjunctivae and EOM are normal.  Neck: Normal range of motion. Neck supple.  Cardiovascular: Normal rate and regular rhythm.   Pulmonary/Chest: Effort normal and breath sounds normal. He has no wheezes.  Abdominal: Soft. Bowel sounds are normal. He exhibits no distension. There is no tenderness.  Musculoskeletal: He exhibits no deformity.  Neurological: He is alert and oriented to person, place, and time.  Skin: Skin is warm.  Nursing note and vitals reviewed.    ED Treatments / Results  Labs (all labs ordered are listed, but only abnormal results are displayed) Labs Reviewed  BASIC METABOLIC PANEL - Abnormal; Notable for the following:       Result Value   Chloride 100 (*)    Glucose, Bld 119 (*)  All other components within normal limits  CBC - Abnormal; Notable for the following:    WBC 26.5 (*)    All other components within normal limits  URINALYSIS, ROUTINE W REFLEX MICROSCOPIC    EKG  EKG Interpretation None       Radiology Dg Chest 2 View  Result Date: 08/17/2017 CLINICAL DATA:  Shortness of breath with cough and chills. EXAM: CHEST  2 VIEW COMPARISON:  08/17/2015 FINDINGS: In the lateral the lateral projection is a small opacity over the heart apex, suspected bronchopneumonia in this setting. Hyperinflation. Normal heart size and mediastinal contours. IMPRESSION: 1. Findings of bronchopneumonia in the lateral projection. Followup PA and lateral chest X-ray is  recommended in 3-4 weeks following trial of antibiotic therapy to ensure resolution. 2. Large lung volumes, question COPD in this smoker. Electronically Signed   By: Marnee Spring M.D.   On: 08/17/2017 13:32    Procedures Procedures (including critical care time)  Medications Ordered in ED Medications  levofloxacin (LEVAQUIN) IVPB 750 mg (750 mg Intravenous New Bag/Given 08/17/17 1841)  sodium chloride 0.9 % bolus 1,000 mL (1,000 mLs Intravenous New Bag/Given 08/17/17 1812)     Initial Impression / Assessment and Plan / ED Course  I have reviewed the triage vital signs and the nursing notes.  Pertinent labs & imaging results that were available during my care of the patient were reviewed by me and considered in my medical decision making (see chart for details).    Pt comes in with cc of cough, fevers, chills. Pt has a CAP. CURB 65 score is 0.  Pt was ambulated and had no hypoxia and he has no shortness of breath. Pt is tachycardic.  Final Clinical Impressions(s) / ED Diagnoses   Final diagnoses:  Community acquired pneumonia, unspecified laterality    New Prescriptions New Prescriptions   LEVOFLOXACIN (LEVAQUIN) 500 MG TABLET    Take 1 tablet (500 mg total) by mouth daily.     Derwood Kaplan, MD 08/17/17 2001

## 2017-08-17 NOTE — ED Notes (Signed)
Pt. Stated that he did not come in with a fever, although it had been on and off prior to coming to the emergency dept. Stated that he did not take any medication (that may have reduced a fever) prior to coming here. Temp noted to be 102.1 at 1550.

## 2018-04-07 ENCOUNTER — Emergency Department (HOSPITAL_COMMUNITY): Payer: No Typology Code available for payment source

## 2018-04-07 ENCOUNTER — Encounter (HOSPITAL_COMMUNITY): Payer: Self-pay | Admitting: Emergency Medicine

## 2018-04-07 ENCOUNTER — Emergency Department (HOSPITAL_COMMUNITY)
Admission: EM | Admit: 2018-04-07 | Discharge: 2018-04-07 | Disposition: A | Discharge status: Home / Self Care | Payer: No Typology Code available for payment source | Attending: Emergency Medicine | Admitting: Emergency Medicine

## 2018-04-07 ENCOUNTER — Other Ambulatory Visit: Payer: Self-pay

## 2018-04-07 DIAGNOSIS — Y999 Unspecified external cause status: Secondary | ICD-10-CM | POA: Insufficient documentation

## 2018-04-07 DIAGNOSIS — Y939 Activity, unspecified: Secondary | ICD-10-CM | POA: Insufficient documentation

## 2018-04-07 DIAGNOSIS — S43102A Unspecified dislocation of left acromioclavicular joint, initial encounter: Secondary | ICD-10-CM | POA: Insufficient documentation

## 2018-04-07 DIAGNOSIS — S01112A Laceration without foreign body of left eyelid and periocular area, initial encounter: Secondary | ICD-10-CM | POA: Insufficient documentation

## 2018-04-07 DIAGNOSIS — M25512 Pain in left shoulder: Secondary | ICD-10-CM | POA: Insufficient documentation

## 2018-04-07 DIAGNOSIS — Y929 Unspecified place or not applicable: Secondary | ICD-10-CM | POA: Insufficient documentation

## 2018-04-07 DIAGNOSIS — S0181XA Laceration without foreign body of other part of head, initial encounter: Secondary | ICD-10-CM

## 2018-04-07 DIAGNOSIS — F17210 Nicotine dependence, cigarettes, uncomplicated: Secondary | ICD-10-CM | POA: Insufficient documentation

## 2018-04-07 DIAGNOSIS — S0990XA Unspecified injury of head, initial encounter: Secondary | ICD-10-CM

## 2018-04-07 HISTORY — DX: Other psychoactive substance abuse, uncomplicated: F19.10

## 2018-04-07 LAB — ETHANOL: Alcohol, Ethyl (B): <10 mg/dL (ref <10)

## 2018-04-07 LAB — CBC WITH DIFFERENTIAL/PLATELET
Abs Immature Granulocytes: 0.1 10*3/uL (ref 0.0–0.1)
Basophils Absolute: 0 10*3/uL (ref 0.0–0.1)
Basophils Relative: 0 %
EOS ABS: 0.2 10*3/uL (ref 0.0–0.7)
Eosinophils Relative: 1 %
HEMATOCRIT: 47.2 % (ref 39.0–52.0)
Hemoglobin: 15.4 g/dL (ref 13.0–17.0)
Immature Granulocytes: 1 %
Lymphocytes Relative: 5 %
Lymphs Abs: 0.9 10*3/uL (ref 0.7–4.0)
MCH: 30.6 pg (ref 26.0–34.0)
MCHC: 32.6 g/dL (ref 30.0–36.0)
MCV: 93.8 fL (ref 78.0–100.0)
Monocytes Absolute: 1.2 10*3/uL — ABNORMAL HIGH (ref 0.1–1.0)
Monocytes Relative: 6 %
NEUTROS ABS: 16.4 10*3/uL — AB (ref 1.7–7.7)
NEUTROS PCT: 87 %
PLATELETS: 290 10*3/uL (ref 150–400)
RBC: 5.03 MIL/uL (ref 4.22–5.81)
RDW: 12.8 % (ref 11.5–15.5)
WBC: 18.7 10*3/uL — AB (ref 4.0–10.5)

## 2018-04-07 LAB — COMPREHENSIVE METABOLIC PANEL
ALK PHOS: 85 U/L (ref 38–126)
ALT: 23 U/L (ref 17–63)
ANION GAP: 10 (ref 5–15)
AST: 32 U/L (ref 15–41)
Albumin: 3.9 g/dL (ref 3.5–5.0)
BILIRUBIN TOTAL: 1.3 mg/dL — AB (ref 0.3–1.2)
BUN: 9 mg/dL (ref 6–20)
CALCIUM: 8.6 mg/dL — AB (ref 8.9–10.3)
CO2: 25 mmol/L (ref 22–32)
CREATININE: 1.27 mg/dL — AB (ref 0.61–1.24)
Chloride: 104 mmol/L (ref 101–111)
GFR, EST NON AFRICAN AMERICAN: 58 mL/min — AB (ref >60)
Glucose, Bld: 101 mg/dL — ABNORMAL HIGH (ref 65–99)
Potassium: 3.3 mmol/L — ABNORMAL LOW (ref 3.5–5.1)
SODIUM: 139 mmol/L (ref 135–145)
TOTAL PROTEIN: 7.3 g/dL (ref 6.5–8.1)

## 2018-04-07 LAB — URINALYSIS, ROUTINE W REFLEX MICROSCOPIC
Bacteria, UA: NONE SEEN
Bilirubin Urine: NEGATIVE
Glucose, UA: NEGATIVE mg/dL
KETONES UR: 5 mg/dL — AB
LEUKOCYTES UA: NEGATIVE
Nitrite: NEGATIVE
PROTEIN: NEGATIVE mg/dL
Specific Gravity, Urine: 1.006 (ref 1.005–1.030)
pH: 6 (ref 5.0–8.0)

## 2018-04-07 LAB — RAPID URINE DRUG SCREEN, HOSP PERFORMED
Amphetamines: NOT DETECTED
Barbiturates: NOT DETECTED
Benzodiazepines: NOT DETECTED
Cocaine: POSITIVE — AB
OPIATES: NOT DETECTED
TETRAHYDROCANNABINOL: NOT DETECTED

## 2018-04-07 MED ORDER — POTASSIUM CHLORIDE CRYS ER 20 MEQ PO TBCR
40.0000 meq | EXTENDED_RELEASE_TABLET | Freq: Once | ORAL | Status: AC
  Administered 2018-04-07: 40 meq via ORAL
  Filled 2018-04-07: qty 2

## 2018-04-07 MED ORDER — SODIUM CHLORIDE 0.9 % IV BOLUS
1000.0000 mL | Freq: Once | INTRAVENOUS | Status: AC
  Administered 2018-04-07: 1000 mL via INTRAVENOUS
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
## 2018-04-07 NOTE — ED Notes (Signed)
Spoke with ortho tech in reference to sling application

## 2018-04-07 NOTE — Progress Notes (Signed)
Orthopedic Tech Progress Note Patient Details:  Stephen Lyons 1956/09/05 [id number]  Ortho Devices Type of Ortho Device: Arm sling Ortho Device/Splint Interventions: Application   Post Interventions Patient Tolerated: Well Instructions Provided: Care of device   Saul Fordyce 04/07/2018, 12:21 PM

## 2018-04-07 NOTE — ED Notes (Signed)
Pt informed of need for urine specimen - urinal left at bedside
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
## 2018-04-07 NOTE — ED Notes (Signed)
Resting quietly on stretcher with eyes closed - easily arousable; speaking in low monotone voice; interm humming; c-collar remains intact; warm blankets applied for comfort

## 2018-04-07 NOTE — Discharge Instructions (Addendum)
You have been seen today for a shoulder injury. There was evidence of what is known as an AC separation. Pain: Take 600 mg of ibuprofen every 6 hours or 440 mg (over the counter dose) to 500 mg (prescription dose) of naproxen every 12 hours for the next 3 days. After this time, these medications may be used as needed for pain. Take these medications with food to avoid upset stomach. Choose only one of these medications, do not take them together.  Tylenol: Should you continue to have additional pain while taking the ibuprofen or naproxen, you may add in tylenol as needed. Your daily total maximum amount of tylenol from all sources should be limited to /day for persons without liver problems, or /day for those with liver problems. Ice: May apply ice to the area over the next 24 hours for 15 minutes at a time to reduce swelling. Elevation: Keep the extremity elevated as often as possible to reduce pain and inflammation. Support: Wear the sling for support and comfort. Wear this until pain resolves.  Exercises: Start by performing these exercises a few times a week, increasing the frequency until you are performing them twice daily.  Follow up: Follow-up with the orthopedic specialist on this matter.    Head Injury You have been seen today for a head injury. It does not appear to be serious at this time.  Close observation: The close observation period is usually 6 hours from the injury. This includes staying awake and having a trustworthy adult monitor you to assure your condition does not worsen. You should be in regular contact with this person and ideally, they should be able to monitor you in person.  Secondary observation: The secondary observation period is usually 24 hours from the injury. You are allowed to sleep during this time. A trustworthy adult should intermittently monitor you to assure your condition does not worsen.   Overall head injury/concussion care: Rest: Be sure to  get plenty of rest. You will need more rest and sleep while you recover. Hydration: Be sure to stay well hydrated by having a goal of drinking about 0.5 liters of water an hour. Pain:  Antiinflammatory medications: Take 600 mg of ibuprofen every 6 hours or 440 mg (over the counter dose) to 500 mg (prescription dose) of naproxen every 12 hours or for the next 3 days. After this time, these medications may be used as needed for pain. Take these medications with food to avoid upset stomach. Choose only one of these medications, do not take them together. Tylenol: Should you continue to have additional pain while taking the ibuprofen or naproxen, you may add in tylenol as needed. Your daily total maximum amount of tylenol from all sources should be limited to /day for persons without liver problems, or /day for those with liver problems. Return to sports and activities: In general, you may return to normal activities once symptoms have subsided, however, you would ideally be cleared by a primary care provider or other qualified medical professional prior to return to these activities.  Follow up: Follow up with the concussion clinic or your primary care provider for further management of this issue. Return: Return to the ED should you begin to have confusion, abnormal behavior, aggression, violence, or personality changes, repeated vomiting, vision loss, numbness or weakness on one side of the body, difficulty standing due to dizziness, significantly worsening pain, or any other major concerns.  Wound care - Dermabond Your wound has been closed with a  medical-grade glue called Dermabond. Please adhere to the following wound care instructions:  You may shower, but avoid submerging the wound. Do not scrub the wound, as this may cause the glue to wear off prematurely.    The glue will wear off on its own, usually within 5-10 days. During this time period DO NOT apply antibiotic ointments or any  other ointments or lotions to the area as this can cause the glue to wear off prematurely.  After 10 days, you may apply ointments, such as Neosporin, to the area to help the remaining glue to wear off.   After the wound has healed and the glue is gone, you may apply ointments such as Aquaphor to the wound to reduce scarring.  May use ibuprofen, naproxen, or Tylenol for pain.  Return to the ED should the wound edges come apart or signs of infection arise, such as spreading redness, puffiness/swelling, pus draining from the wound, severe increase in pain, or any other major issues.

## 2018-04-07 NOTE — ED Notes (Signed)
Pt still unable to urinate 

## 2018-07-21 ENCOUNTER — Emergency Department (HOSPITAL_COMMUNITY)
Admission: EM | Admit: 2018-07-21 | Discharge: 2018-07-21 | Disposition: A | Discharge status: Home / Self Care | Payer: No Typology Code available for payment source | Attending: Emergency Medicine | Admitting: Emergency Medicine

## 2018-07-21 ENCOUNTER — Other Ambulatory Visit: Payer: Self-pay

## 2018-07-21 ENCOUNTER — Emergency Department (HOSPITAL_COMMUNITY): Payer: No Typology Code available for payment source

## 2018-07-21 ENCOUNTER — Encounter (HOSPITAL_COMMUNITY): Payer: Self-pay | Admitting: Emergency Medicine

## 2018-07-21 DIAGNOSIS — Y939 Activity, unspecified: Secondary | ICD-10-CM | POA: Insufficient documentation

## 2018-07-21 DIAGNOSIS — J410 Simple chronic bronchitis: Secondary | ICD-10-CM

## 2018-07-21 DIAGNOSIS — R062 Wheezing: Secondary | ICD-10-CM | POA: Insufficient documentation

## 2018-07-21 DIAGNOSIS — T148XXA Other injury of unspecified body region, initial encounter: Secondary | ICD-10-CM

## 2018-07-21 DIAGNOSIS — F17210 Nicotine dependence, cigarettes, uncomplicated: Secondary | ICD-10-CM | POA: Insufficient documentation

## 2018-07-21 DIAGNOSIS — M62830 Muscle spasm of back: Secondary | ICD-10-CM | POA: Insufficient documentation

## 2018-07-21 MED ORDER — IPRATROPIUM-ALBUTEROL 0.5-2.5 (3) MG/3ML IN SOLN
3.0000 mL | Freq: Once | RESPIRATORY_TRACT | Status: AC
  Administered 2018-07-21: 3 mL via RESPIRATORY_TRACT
  Filled 2018-07-21: qty 3

## 2018-07-21 MED ORDER — ALBUTEROL SULFATE (2.5 MG/3ML) 0.083% IN NEBU: 5.0000 mg | INHALATION_SOLUTION | Freq: Once | RESPIRATORY_TRACT | Status: DC

## 2018-07-21 NOTE — ED Notes (Signed)
Patient verbalizes understanding of discharge instructions. Opportunity for questioning and answers were provided. Pt discharged from ED. 

## 2018-07-21 NOTE — ED Provider Notes (Signed)
[facility] EMERGENCY DEPARTMENT Provider Note  CSN: [id number] Arrival date & time: 07/21/18 8938  Chief Complaint(s) Shortness of Breath and Cough  HPI Stephen Lyons is a 62 y.o. male   The history is provided by the patient.  Shortness of Breath  This is a chronic (usually with coughing; has been coughing more recently) problem. The problem occurs frequently.The problem has not changed since onset.Associated symptoms include sputum production (clear white). Pertinent negatives include no fever, no chest pain, no vomiting, no abdominal pain and no leg swelling. Risk factors include smoking.  Back Pain   This is a new problem. The current episode started 6 to 12 hours ago. Episode frequency: intermittent. The problem has been resolved. Associated with: coughing and heavy lifting. The pain is present in the thoracic spine. The quality of the pain is described as aching. The pain is mild. The symptoms are aggravated by twisting (and coughing). Pertinent negatives include no chest pain, no fever and no abdominal pain.    Past Medical History Past Medical History:  Diagnosis Date  . Hepatitis C   . Polysubstance abuse [facility])    Patient Active Problem List   Diagnosis Date Noted  . Liver mass 07/29/2015  . Chronic hepatitis C without hepatic coma (HCC) 06/30/2015  . Substance abuse (HCC) 06/30/2015   Home Medication(s) Prior to Admission medications   Not on File                                                                                                                                    Past Surgical History History reviewed. No pertinent surgical history. Family History No family history on file.  Social History Social History   Tobacco Use  . Smoking status: Current Every Day Smoker    Packs/day: 0.00    Types: Cigarettes    Start date: 11/20/1968  . Smokeless tobacco: Never Used  Substance Use Topics  . Alcohol use: Yes    Alcohol/week: 0.0 standard  drinks    Comment: socially  . Drug use: Yes    Types: Marijuana, Cocaine    Comment: daily, needs SA   Allergies Aspirin  Review of Systems Review of Systems  Constitutional: Negative for fever.  Respiratory: Positive for sputum production (clear white).   Cardiovascular: Negative for chest pain and leg swelling.  Gastrointestinal: Negative for abdominal pain and vomiting.  Musculoskeletal: Positive for back pain.   All other systems are reviewed and are negative for acute change except as noted in the HPI  Physical Exam Vital Signs  I have reviewed the triage vital signs BP 110/79   Pulse 75   Temp 97.9 F (36.6 C) (Oral)   Resp 16   Ht 5\' 10"  (1.778 m)   Wt 70.3 kg   SpO2 94%   BMI 22.24 kg/m   Physical Exam  Constitutional: He is oriented to person, place, and time.  He appears well-developed and well-nourished. No distress.  HENT:  Head: Normocephalic and atraumatic.  Nose: Nose normal.  Eyes: Pupils are equal, round, and reactive to light. Conjunctivae and EOM are normal. Right eye exhibits no discharge. Left eye exhibits no discharge. No scleral icterus.  Neck: Normal range of motion. Neck supple.  Cardiovascular: Normal rate and regular rhythm. Exam reveals no gallop and no friction rub.  No murmur heard. Pulmonary/Chest: Effort normal. No stridor. No respiratory distress. He has wheezes (diffuse expiratory). He has no rales.  Abdominal: Soft. He exhibits no distension. There is no tenderness.  Musculoskeletal: He exhibits no edema or tenderness.  Neurological: He is alert and oriented to person, place, and time.  Skin: Skin is warm and dry. No rash noted. He is not diaphoretic. No erythema.  Psychiatric: He has a normal mood and affect.  Vitals reviewed.   ED Results and Treatments Labs (all labs ordered are listed, but only abnormal results are displayed) Labs Reviewed - No data to display                                                                                                                        EKG  EKG Interpretation  Date/Time:  Sunday July 21 2018 05:01:24 EDT Ventricular Rate:  81 PR Interval:    QRS Duration: 80 QT Interval:  374 QTC Calculation: 435 R Axis:   70 Text Interpretation:  Sinus rhythm Consider left atrial enlargement No significant change since last tracing Confirmed by Drema Pry [phone]) on 07/21/2018 5:33:11 AM      Radiology Dg Chest 2 View  Result Date: 07/21/2018 CLINICAL DATA:  Worsening shortness of breath for a week. Cough and back pain. History of polysubstance abuse. EXAM: CHEST - 2 VIEW COMPARISON:  Chest radiograph August 17, 2017 FINDINGS: Cardiomediastinal silhouette is normal. No pleural effusions or focal consolidations. Hyperinflation. Trachea projects midline and there is no pneumothorax. Soft tissue planes and included osseous structures are non-suspicious. IMPRESSION: Similar hyperinflation without focal consolidation. Electronically Signed   By: Awilda Metro M.D.   On: 07/21/2018 05:49   Pertinent labs & imaging results that were available during my care of the patient were reviewed by me and considered in my medical decision making (see chart for details).  Medications Ordered in ED Medications  ipratropium-albuterol (DUONEB) 0.5-2.5 (3) MG/3ML nebulizer solution 3 mL (3 mLs Nebulization Given 07/21/18 0550)  Procedures Procedures Counseled patient for approximately 5 minutes regarding smoking cessation. Discussed risks of smoking and how they applied and affected their visit here today. Patient not ready to quit at this time, however will follow up with their primary doctor when they are. Provided with resources.  CPT code: 16109: intermediate counseling for smoking cessation  (including critical care time)  Medical Decision Making / ED  Course I have reviewed the nursing notes for this encounter and the patient's prior records (if available in EHR or on provided paperwork).    Chronic smoker with worsened cough; nonpurulent sputum. Wheezing on exam. Given duoneb. Chest x-ray without evidence suggestive of pneumonia, pneumothorax. Improved SOB and wheezing following treatment.  Back pain only with twisting and cough. Reproduced during encounter. Likely muscular.  The patient appears reasonably screened and/or stabilized for discharge and I doubt any other medical condition or other [facility] requiring further screening, evaluation, or treatment in the ED at this time prior to discharge.  The patient is safe for discharge with strict return precautions.    Final Clinical Impression(s) / ED Diagnoses Final diagnoses:  Smokers' cough (HCC)  Wheezing  Muscle strain   Disposition: Discharge  Condition: Good  I have discussed the results, Dx and Tx plan with the patient who expressed understanding and agree(s) with the plan. Discharge instructions discussed at great length. The patient was given strict return precautions who verbalized understanding of the instructions. No further questions at time of discharge.    ED Discharge Orders    None       Follow Up: Primary care provider   If you do not have a primary care physician, contact [facility] at [phone] for referral      This chart was dictated using voice recognition software.  Despite best efforts to proofread,  errors can occur which can change the documentation meaning.   Nira Conn, MD 07/21/18 [phone]

## 2018-07-21 NOTE — ED Triage Notes (Signed)
Pt reports SOB that is "getting worser" X1 week.  Pt states he has been having Coughing fits.  "I have couched so much my back hurts"

## 2022-03-08 ENCOUNTER — Other Ambulatory Visit (HOSPITAL_COMMUNITY): Payer: Self-pay

## 2022-03-08 ENCOUNTER — Telehealth: Payer: Self-pay

## 2022-03-08 NOTE — Telephone Encounter (Signed)
[facility] Patient Advocate Encounter ? ?Insurance verification completed.   ? ?The patient is insured through [employer]. ? ?Patient will have to try Harvoni or Epclusa 1st and fail before [place].  ? ?Medication will need a PA . ? ?We will continue to follow to see if copay assistance is needed. ? ?Ileene Patrick, CPhT ?Specialty Pharmacy Patient Advocate ?[place] [facility] ?Phone: [phone] ?Fax:  [phone]  ?

## 2022-03-09 ENCOUNTER — Encounter: Payer: No Typology Code available for payment source | Admitting: Family

## 2022-03-09 ENCOUNTER — Encounter: Payer: Self-pay | Admitting: Family

## 2022-03-09 ENCOUNTER — Ambulatory Visit (INDEPENDENT_AMBULATORY_CARE_PROVIDER_SITE_OTHER): Payer: Medicare HMO | Admitting: Family

## 2022-03-09 ENCOUNTER — Other Ambulatory Visit: Payer: Self-pay

## 2022-03-09 VITALS — BP 115/70 | HR 72 | Temp 97.9°F | Wt 153.0 lb

## 2022-03-09 DIAGNOSIS — B182 Chronic viral hepatitis C: Secondary | ICD-10-CM

## 2022-03-09 DIAGNOSIS — F1910 Other psychoactive substance abuse, uncomplicated: Secondary | ICD-10-CM | POA: Diagnosis not present

## 2022-03-09 NOTE — Assessment & Plan Note (Signed)
Stephen Lyons continues to use cocaine and marijuana is not injecting. He remains at risk for reinfection at the completion of treatment. Discussed treatment options and counseling resources.  ?

## 2022-03-09 NOTE — Progress Notes (Signed)
? ?Subjective:  ? ? Patient ID: Stephen Lyons, male    DOB: 01-Sep-1956, 66 y.o.   MRN: [id number] ? ?Chief Complaint  ?Patient presents with  ? Hepatitis C  ? ? ?HPI: ? ?Stephen Lyons is a 66 y.o. male with previous medical history of Hepatitis C and substance abuse presenting today for evaluation of Hepatitis C.  ? ?Stephen Lyons was initially diagnosed with hepatitis C in 2016 and was seen by Dr. 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  There was concern a liver mass on elastography which was noted as a hemangoma on subsequent MRI. Did not complete treatment at that time. Remains treatment naive and denies any current symptoms including nausea, vomiting, abdominal pain, fatigue, scleral icterus or jaundice. No personal or family history of liver disease. Risk factor for Hepatitis C includes IV drug use and is currently not injecting but continues to  use cocaine and marijuana.   ? ? ? ?Allergies  ?Allergen Reactions  ? Aspirin Anaphylaxis  ? ? ? ? ?No outpatient medications prior to visit.  ? ?No facility-administered medications prior to visit.  ? ? ? ?Past Medical History:  ?Diagnosis Date  ? COPD (chronic obstructive pulmonary disease) ([place])   ? Hepatitis C   ? Polysubstance abuse ([place])   ? ? ? ?No past surgical history on file. ? ? ? ? ?Review of Systems  ?Constitutional:  Negative for appetite change, chills, fatigue, fever and unexpected weight change.  ?Eyes:  Negative for visual disturbance.  ?Respiratory:  Negative for cough, chest tightness, shortness of breath and wheezing.   ?Cardiovascular:  Negative for chest pain and leg swelling.  ?Gastrointestinal:  Negative for abdominal pain, constipation, diarrhea, nausea and vomiting.  ?Genitourinary:  Negative for dysuria, flank pain, frequency, genital sores, hematuria and urgency.  ?Skin:  Negative for rash.  ?Allergic/Immunologic: Negative for immunocompromised state.  ?Neurological:  Negative for dizziness and  headaches.  ?   ?Objective:  ?  ?BP 115/70   Pulse 72   Temp 97.9 ?F (36.6 ?C) (Oral)   Wt 153 lb (69.4 kg)   SpO2 98%   BMI 21.95 kg/m?  ?Nursing note and vital signs reviewed. ? ?Physical Exam ?Constitutional:   ?   General: He is not in acute distress. ?   Appearance: He is well-developed.  ?HENT:  ?   Mouth/Throat:  ?   Dentition: Abnormal dentition.  ?Eyes:  ?   Conjunctiva/sclera: Conjunctivae normal.  ?Cardiovascular:  ?   Rate and Rhythm: Normal rate and regular rhythm.  ?   Heart sounds: Normal heart sounds. No murmur heard. ?  No friction rub. No gallop.  ?Pulmonary:  ?   Effort: Pulmonary effort is normal. No respiratory distress.  ?   Breath sounds: Normal breath sounds. No wheezing or rales.  ?Chest:  ?   Chest wall: No tenderness.  ?Abdominal:  ?   General: Bowel sounds are normal.  ?   Palpations: Abdomen is soft.  ?   Tenderness: There is no abdominal tenderness.  ?Musculoskeletal:  ?   Cervical back: Neck supple.  ?Lymphadenopathy:  ?   Cervical: No cervical adenopathy.  ?Skin: ?   General: Skin is warm and dry.  ?   Findings: No rash.  ?Neurological:  ?   Mental Status: He is alert and oriented to person, place, and time.  ?Psychiatric:     ?   Behavior: Behavior normal.     ?  Thought Content: Thought content normal.     ?   Judgment: Judgment normal.  ? ? ? ? ?  ? ? ?  06/30/2015  ?  3:17 PM  ?Depression screen PHQ 2/9  ?Decreased Interest 2  ?Down, Depressed, Hopeless 2  ?PHQ - 2 Score 4  ?Altered sleeping 1  ?Tired, decreased energy 0  ?Change in appetite 1  ?Feeling bad or failure about yourself  1  ?Trouble concentrating 0  ?Moving slowly or fidgety/restless 1  ?Suicidal thoughts 0  ?PHQ-9 Score 8  ?Difficult doing work/chores Very difficult  ?  ?   ?Assessment & Plan:  ? ? ?Patient Active Problem List  ? Diagnosis Date Noted  ? Liver mass 07/29/2015  ? Chronic hepatitis C without hepatic coma ([place]) 06/30/2015  ? Substance abuse ([place]) 06/30/2015  ? ? ? ?Problem List Items Addressed This  Visit   ? ?  ? Digestive  ? Chronic hepatitis C without hepatic coma (HCC)  ?  Stephen Lyons is a 56 y/o AA male with Genotype 1a chronic hepatitis C with risk factor of IV drug use. Remains treatment naive and asymptomatic. Previous MRI for liver lesion identified hemangioma. Reviewed the pathogenesis, risks if left untreated, transmission, treatment options and financial assistance for Hepatitis C. Check lab work today. Hepatitis B and HIV previously negative. Plan for 12 weeks of Eplcusa pending lab work results.  ? ?  ?  ?  ? Other  ? Substance abuse ([place])  ?  Stephen Lyons continues to use cocaine and marijuana is not injecting. He remains at risk for reinfection at the completion of treatment. Discussed treatment options and counseling resources.  ? ?  ?  ? ?Other Visit Diagnoses   ? ? Chronic hepatitis C with hepatic coma ([place])    -  Primary  ? Relevant Orders  ? Hepatic function panel  ? Protime-INR  ? Liver Fibrosis, FibroTest-ActiTest  ? Hepatitis C genotype  ? CBC  ? ?  ? ? ? ?Stephen Lyons does not currently have medications on file. ? ? ?Follow-up: 1 month after starting medication or sooner if needed.  ? ? ?Terri Piedra, MSN, FNP-C ?Nurse Practitioner ?[place] [facility] ?[facility] ?[facility] Main number: [phone] ? ? ?

## 2022-03-09 NOTE — Assessment & Plan Note (Addendum)
Stephen Lyons is a 66 y/o AA male with Genotype 1a chronic hepatitis C with risk factor of IV drug use. Remains treatment naive and asymptomatic. Previous MRI for liver lesion identified hemangioma. Reviewed the pathogenesis, risks if left untreated, transmission, treatment options and financial assistance for Hepatitis C. Check lab work today. Hepatitis B and HIV previously negative. Plan for 12 weeks of Eplcusa pending lab work results.  ?

## 2022-03-09 NOTE — Patient Instructions (Signed)
Nice to see you.  We will check your lab work today.  Follow up in 1 month after starting medication.  Have a great day and stay safe!  Limit acetaminophen (Tylenol) usage to no more than 2 grams (2,000 mg) per day.  Avoid alcohol.  Do not share toothbrushes or razors.  Practice safe sex to protect against transmission as well as sexually transmitted disease.    Hepatitis C Hepatitis C is a viral infection of the liver. It can lead to scarring of the liver (cirrhosis), liver failure, or liver cancer. Hepatitis C may go undetected for months or years because people with the infection may not have symptoms, or they may have only mild symptoms. What are the causes? This condition is caused by the hepatitis C virus (HCV). The virus can spread from person to person (is contagious) through: Blood. Childbirth. A woman who has hepatitis C can pass it to her baby during birth. Bodily fluids, such as breast milk, tears, semen, vaginal fluids, and saliva. Blood transfusions or organ transplants done in the [place] before 1992.  What increases the risk? The following factors may make you more likely to develop this condition: Having contact with unclean (contaminated) needles or syringes. This may result from: Acupuncture. Tattoing. Body piercing. Injecting drugs. Having unprotected sex with someone who is infected. Needing treatment to filter your blood (kidney dialysis). Having HIV (human immunodeficiency virus) or AIDS (acquired immunodeficiency syndrome). Working in a job that involves contact with blood or bodily fluids, such as health care.  What are the signs or symptoms? Symptoms of this condition include: Fatigue. Loss of appetite. Nausea. Vomiting. Abdominal pain. Dark yellow urine. Yellowish skin and eyes (jaundice). Itchy skin. Clay-colored bowel movements. Joint pain. Bleeding and bruising easily. Fluid building up in your stomach (ascites).  In some cases,  you may not have any symptoms. How is this diagnosed? This condition is diagnosed with: Blood tests. Other tests to check how well your liver is functioning. They may include: Magnetic resonance elastography (MRE). This imaging test uses MRIs and sound waves to measure liver stiffness. Transient elastography. This imaging test uses ultrasounds to measure liver stiffness. Liver biopsy. This test requires taking a small tissue sample from your liver to examine it under a microscope.  How is this treated? Your health care provider may perform noninvasive tests or a liver biopsy to help decide the best course of treatment. Treatment may include: Antiviral medicines and other medicines. Follow-up treatments every 6-12 months for infections or other liver conditions. Receiving a donated liver (liver transplant).  Follow these instructions at home: Medicines Take over-the-counter and prescription medicines only as told by your health care provider. Take your antiviral medicine as told by your health care provider. Do not stop taking the antiviral even if you start to feel better. Do not take any medicines unless approved by your health care provider, including over-the-counter medicines and birth control pills. Activity Rest as needed. Do not have sex unless approved by your health care provider. Ask your health care provider when you may return to school or work. Eating and drinking Eat a balanced diet with plenty of fruits and vegetables, whole grains, and lowfat (lean) meats or non-meat proteins (such as beans or tofu). Drink enough fluids to keep your urine clear or pale yellow. Do not drink alcohol. General instructions Do not share toothbrushes, nail clippers, or razors. Wash your hands frequently with soap and water. If soap and water are not available, use hand   sanitizer. Cover any cuts or open sores on your skin to prevent spreading the virus. Keep all follow-up visits as told by  your health care provider. This is important. You may need follow-up visits every 6-12 months. How is this prevented? There is no vaccine for hepatitis C. The only way to prevent the disease is to reduce the risk of exposure to the virus. Make sure you: Wash your hands frequently with soap and water. If soap and water are not available, use hand sanitizer. Do not share needles or syringes. Practice safe sex and use condoms. Avoid handling blood or bodily fluids without gloves or other protection. Avoid getting tattoos or piercings in shops or other locations that are not clean.  Contact a health care provider if: You have a fever. You develop abdominal pain. You pass dark urine. You pass clay-colored stools. You develop joint pain. Get help right away if: You have increasing fatigue or weakness. You lose your appetite. You cannot eat or drink without vomiting. You develop jaundice or your jaundice gets worse. You bruise or bleed easily. Summary Hepatitis C is a viral infection of the liver. It can lead to scarring of the liver (cirrhosis), liver failure, or liver cancer. The hepatitis C virus (HCV) causes this condition. The virus can pass from person to person (is contagious). You should not take any medicines unless approved by your health care provider. This includes over-the-counter medicines and birth control pills. This information is not intended to replace advice given to you by your health care provider. Make sure you discuss any questions you have with your health care provider. Document Released: 11/03/2000 Document Revised: 12/12/2016 Document Reviewed: 12/12/2016 Elsevier Interactive Patient Education  2018 Elsevier Inc.  

## 2022-03-15 LAB — LIVER FIBROSIS, FIBROTEST-ACTITEST
ALT: 23 U/L (ref 9–46)
Alpha-2-Macroglobulin: 257 mg/dL (ref 106–279)
Apolipoprotein A1: 157 mg/dL (ref 94–176)
Bilirubin: 0.6 mg/dL (ref 0.2–1.2)
Fibrosis Score: 0.38
GGT: 18 U/L (ref 3–70)
Haptoglobin: 129 mg/dL (ref 43–212)
Necroinflammat ACT Score: 0.11
Reference ID: 4335961

## 2022-03-15 LAB — CBC
HCT: 46.4 % (ref 38.5–50.0)
Hemoglobin: 16.1 g/dL (ref 13.2–17.1)
MCH: 32.3 pg (ref 27.0–33.0)
MCHC: 34.7 g/dL (ref 32.0–36.0)
MCV: 93 fL (ref 80.0–100.0)
MPV: 9.9 fL (ref 7.5–12.5)
Platelets: 289 10*3/uL (ref 140–400)
RBC: 4.99 10*6/uL (ref 4.20–5.80)
RDW: 12.5 % (ref 11.0–15.0)
WBC: 8.5 10*3/uL (ref 3.8–10.8)

## 2022-03-15 LAB — HEPATIC FUNCTION PANEL
AG Ratio: 1.2 (calc) (ref 1.0–2.5)
ALT: 24 U/L (ref 9–46)
AST: 19 U/L (ref 10–35)
Albumin: 3.7 g/dL (ref 3.6–5.1)
Alkaline phosphatase (APISO): 89 U/L (ref 35–144)
Bilirubin, Direct: 0.1 mg/dL (ref 0.0–0.2)
Globulin: 3.2 g/dL (calc) (ref 1.9–3.7)
Indirect Bilirubin: 0.5 mg/dL (calc) (ref 0.2–1.2)
Total Bilirubin: 0.6 mg/dL (ref 0.2–1.2)
Total Protein: 6.9 g/dL (ref 6.1–8.1)

## 2022-03-15 LAB — PROTIME-INR
INR: 1
Prothrombin Time: 9.8 s (ref 9.0–11.5)

## 2022-03-15 LAB — HEPATITIS C GENOTYPE

## 2022-03-28 ENCOUNTER — Other Ambulatory Visit (HOSPITAL_COMMUNITY): Payer: Self-pay

## 2022-03-28 ENCOUNTER — Telehealth: Payer: Self-pay | Admitting: Family

## 2022-03-28 ENCOUNTER — Telehealth: Payer: Self-pay

## 2022-03-28 NOTE — Telephone Encounter (Signed)
Patient called with questions about lab results, relayed that lab work was normal. He is asking about next steps. Will route to provider.  ? ?Sandie Ano, RN ? ?

## 2022-03-28 NOTE — Telephone Encounter (Signed)
Called and discussed results and plan of care.  ?

## 2022-03-28 NOTE — Telephone Encounter (Signed)
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 Will proceed with 12 weeks of Epclusa.  ? ?Terri Piedra, NP ?03/28/2022 ?3:13 PM ? ?

## 2022-03-28 NOTE — Telephone Encounter (Signed)
[facility] Patient Advocate Encounter ?  ?Received notification from [facility] that prior authorization for Dorita Fray is required. ?  ?PA submitted on 03/28/2022 ?Key [id number] ?Status is pending ?   ?[facility] will continue to follow. ? ? ?Clearance Coots, CPhT ?Specialty Pharmacy Patient Advocate ?[facility] ?Phone: [phone] ?Fax:  [phone]  ?

## 2022-03-29 ENCOUNTER — Telehealth: Payer: Self-pay

## 2022-03-29 ENCOUNTER — Other Ambulatory Visit (HOSPITAL_COMMUNITY): Payer: Self-pay

## 2022-03-29 NOTE — Telephone Encounter (Signed)
[facility] Patient Advocate Encounter ? ?Prior Authorization for Epclsua(BrandName) has been approved.   ? ?PA# [id number] ?Effective dates: 03/29/22 through 06/21/22 ? ?Patients co-pay is $0.00.  ? ?Prescription can be filled at [facility]. ? ?[facility] will continue to follow. ? ?Clearance Coots, CPhT ?Specialty Pharmacy Patient Advocate ?[facility] ?Phone: [phone] ?Fax:  [phone]  ?

## 2022-03-30 ENCOUNTER — Other Ambulatory Visit (HOSPITAL_COMMUNITY): Payer: Self-pay

## 2022-03-30 ENCOUNTER — Other Ambulatory Visit: Payer: Self-pay | Admitting: Pharmacist

## 2022-03-30 DIAGNOSIS — B182 Chronic viral hepatitis C: Secondary | ICD-10-CM

## 2022-03-30 MED ORDER — EPCLUSA 400-100 MG PO TABS
1.0000 | ORAL_TABLET | Freq: Every day | ORAL | 2 refills | Status: AC
  Filled 2022-03-30 – 2022-03-31 (×2): qty 28, 28d supply, fill #0
  Filled 2022-04-28: qty 28, 28d supply, fill #1
  Filled 2022-05-16: qty 28, 28d supply, fill #2

## 2022-03-31 ENCOUNTER — Telehealth: Payer: Self-pay

## 2022-03-31 ENCOUNTER — Other Ambulatory Visit (HOSPITAL_COMMUNITY): Payer: Self-pay

## 2022-03-31 NOTE — Telephone Encounter (Signed)
Patient called asking how to get his Epclusa. Notified him it was sent to [facility] and provided him with their phone number.  ? ?Sandie Ano, RN ? ?

## 2022-04-04 ENCOUNTER — Telehealth: Payer: Self-pay

## 2022-04-04 ENCOUNTER — Other Ambulatory Visit: Payer: Self-pay

## 2022-04-04 NOTE — Telephone Encounter (Signed)
Billye presented to clinic today to begin treatment for Hepatitis C. He will be starting on Epclusa. ? ?Patient is approved to receive Epclusa x 12 weeks for chronic Hepatitis C infection. Counseled patient to take Epclusa daily with or without food. Encouraged patient not to miss any doses and explained how their chance of cure could go down with each dose missed. Counseled patient on what to do if dose is missed - if it is closer to the missed dose take immediately; if closer to next dose skip dose and take the next dose at the usual time. Counseled patient on common side effects such as headache, fatigue, and nausea and that these normally decrease with time. I reviewed patient medications and found no drug interactions. Discussed with patient that there are several drug interactions including acid suppressants. Instructed patient to call clinic if he wishes to start a new medication during course of therapy. Also advised patient to call if he experiences any side effects. Patient will follow-up in the pharmacy clinic on 05/01/22 with Cassie.  ? ?Shirlee More, PharmD ?PGY2 Infectious Diseases Pharmacy Resident ?  ?

## 2022-04-04 NOTE — Telephone Encounter (Signed)
[facility] Patient Advocate Encounter ? ?Patient's medications have been couriered to [facility] from [facility] and will be picked up 04/05/22. ? ?1st bottle of Epclusa ? ?Clearance Coots , CPhT ?Specialty Pharmacy Patient Advocate ?[facility] ?Phone: [phone] ?Fax:  [phone]  ?

## 2022-04-15 ENCOUNTER — Emergency Department (HOSPITAL_COMMUNITY)
Admission: EM | Admit: 2022-04-15 | Discharge: 2022-04-15 | Disposition: A | Discharge status: Home / Self Care | Payer: Medicare HMO | Attending: Emergency Medicine | Admitting: Emergency Medicine

## 2022-04-15 ENCOUNTER — Other Ambulatory Visit: Payer: Self-pay

## 2022-04-15 DIAGNOSIS — W230XXA Caught, crushed, jammed, or pinched between moving objects, initial encounter: Secondary | ICD-10-CM | POA: Diagnosis not present

## 2022-04-15 DIAGNOSIS — S61214A Laceration without foreign body of right ring finger without damage to nail, initial encounter: Secondary | ICD-10-CM

## 2022-04-15 DIAGNOSIS — Z23 Encounter for immunization: Secondary | ICD-10-CM | POA: Diagnosis not present

## 2022-04-15 DIAGNOSIS — S6991XA Unspecified injury of right wrist, hand and finger(s), initial encounter: Secondary | ICD-10-CM | POA: Diagnosis present

## 2022-04-15 MED ORDER — TETANUS-DIPHTH-ACELL PERTUSSIS 5-2.5-18.5 LF-MCG/0.5 IM SUSY
0.5000 mL | PREFILLED_SYRINGE | Freq: Once | INTRAMUSCULAR | Status: AC
  Administered 2022-04-15: 0.5 mL via INTRAMUSCULAR
  Filled 2022-04-15: qty 0.5

## 2022-04-15 MED ORDER — LIDOCAINE HCL (PF) 1 % IJ SOLN
5.0000 mL | Freq: Once | INTRAMUSCULAR | Status: AC
  Administered 2022-04-15: 5 mL via INTRADERMAL
  Filled 2022-04-15: qty 5

## 2022-04-15 MED ORDER — CEPHALEXIN 500 MG PO CAPS
500.0000 mg | ORAL_CAPSULE | Freq: Three times a day (TID) | ORAL | 0 refills | Status: DC
  Filled 2022-04-15: qty 15, 5d supply, fill #0

## 2022-04-15 MED ORDER — OXYCODONE HCL 5 MG PO TABS: 5.0000 mg | ORAL_TABLET | Freq: Once | ORAL | Status: DC

## 2022-04-15 MED ORDER — CEPHALEXIN 500 MG PO CAPS: 500.0000 mg | ORAL_CAPSULE | Freq: Three times a day (TID) | ORAL | 0 refills | Status: AC

## 2022-04-15 NOTE — ED Provider Notes (Signed)
[facility] EMERGENCY DEPARTMENT Provider Note   CSN: [id number] Arrival date & time: 04/15/22  4081     History  Chief Complaint  Patient presents with   Laceration    Stephen Lyons is a 66 y.o. male.  HPI Patient presenting for lacerations to his right hand.  Approximately 3 hours prior to arrival, patient was climbing over a metal fence.  He had his hand caught on a vertical metal bar.  This caused lacerations and skin tearing at the bases of his fourth digit.  Patient denies any other areas of injury.    Home Medications Prior to Admission medications   Medication Sig Start Date End Date Taking? Authorizing Provider  cephALEXin (KEFLEX) 500 MG capsule Take 1 capsule (500 mg total) by mouth 3 (three) times daily for 5 days. 04/15/22 04/20/22 Yes Gloris Manchester, MD  EPCLUSA 400-100 MG TABS Take 1 tablet by mouth daily. 03/30/22   Jennette Kettle, RPH-CPP      Allergies    Aspirin    Review of Systems   Review of Systems  Skin:  Positive for wound.  All other systems reviewed and are negative.  Physical Exam Updated Vital Signs BP (!) 135/99 (BP Location: Left Arm)   Pulse 89   Temp 98.4 F (36.9 C) (Oral)   Resp 14   Ht 5\' 10"  (1.778 m)   Wt 68 kg   SpO2 97%   BMI 21.52 kg/m  Physical Exam Vitals and nursing note reviewed.  Constitutional:      General: He is not in acute distress.    Appearance: Normal appearance. He is well-developed and normal weight. He is not ill-appearing, toxic-appearing or diaphoretic.  HENT:     Head: Normocephalic and atraumatic.     Right Ear: External ear normal.     Left Ear: External ear normal.     Nose: Nose normal.     Mouth/Throat:     Mouth: Mucous membranes are moist.     Pharynx: Oropharynx is clear.  Eyes:     Extraocular Movements: Extraocular movements intact.     Conjunctiva/sclera: Conjunctivae normal.  Cardiovascular:     Rate and Rhythm: Normal rate and regular rhythm.  Pulmonary:     Effort:  Pulmonary effort is normal. No respiratory distress.     Breath sounds: Normal breath sounds.  Chest:     Chest wall: No tenderness.  Abdominal:     Palpations: Abdomen is soft.     Tenderness: There is no abdominal tenderness.  Musculoskeletal:        General: Signs of injury present. No swelling. Normal range of motion.     Cervical back: Normal range of motion and neck supple.     Right lower leg: No edema.     Left lower leg: No edema.  Skin:    General: Skin is warm and dry.     Capillary Refill: Capillary refill takes less than 2 seconds.  Neurological:     General: No focal deficit present.     Mental Status: He is alert and oriented to person, place, and time.     Cranial Nerves: No cranial nerve deficit.     Sensory: No sensory deficit.     Motor: No weakness.     Coordination: Coordination normal.  Psychiatric:        Mood and Affect: Mood normal.        Behavior: Behavior normal.  Thought Content: Thought content normal.        Judgment: Judgment normal.       ED Results / Procedures / Treatments   Labs (all labs ordered are listed, but only abnormal results are displayed) Labs Reviewed - No data to display  EKG None  Radiology No results found.  Procedures .Marland KitchenLaceration Repair  Date/Time: 04/15/2022 9:33 AM Performed by: Gloris Manchester, MD Authorized by: Gloris Manchester, MD   Consent:    Consent obtained:  Verbal   Consent given by:  Patient   Risks, benefits, and alternatives were discussed: yes     Risks discussed:  Infection, pain and need for additional repair   Alternatives discussed:  No treatment Universal protocol:    Procedure explained and questions answered to patient or proxy's satisfaction: yes     Patient identity confirmed:  Verbally with patient Anesthesia:    Anesthesia method:  Local infiltration   Local anesthetic:  Lidocaine 1% w/o epi Laceration details:    Location:  Finger   Finger location:  R ring finger   Length (cm):   3   Depth (mm):  5 Pre-procedure details:    Preparation:  Patient was prepped and draped in usual sterile fashion Exploration:    Limited defect created (wound extended): no     Hemostasis achieved with:  Direct pressure   Wound exploration: wound explored through full range of motion and entire depth of wound visualized     Contaminated: no   Treatment:    Area cleansed with:  Saline   Amount of cleaning:  Standard   Irrigation solution:  Sterile saline   Irrigation volume:  500cc   Irrigation method:  Syringe   Visualized foreign bodies/material removed: no     Debridement:  None   Undermining:  None   Scar revision: no   Skin repair:    Repair method:  Sutures   Suture size:  5-0   Suture material:  Nylon   Suture technique:  Simple interrupted   Number of sutures:  5 Approximation:    Approximation:  Close Repair type:    Repair type:  Simple Post-procedure details:    Dressing: Xeroform and gauze.   Procedure completion:  Tolerated well, no immediate complications    Medications Ordered in ED Medications  lidocaine (PF) (XYLOCAINE) 1 % injection 5 mL (has no administration in time range)  Tdap (BOOSTRIX) injection 0.5 mL (has no administration in time range)    ED Course/ Medical Decision Making/ A&P                           Medical Decision Making Risk Prescription drug management.   66 year old male presenting for laceration to the base of his fourth digit of his right hand.  This occurred 3 hours prior to arrival.  At the time, patient was jumping over a fence when his hand got caught on the top.  On arrival in the ED, patient is well-appearing.  His wound is hemostatic.  It appears to be an avulsion extending around the palmar and medial aspect of the base of his fourth digit.  Strength and sensation are intact, including finger flexion.  There is no evidence of tendon or vascular injury.  Wound was anesthetized with 1% lidocaine.  Entire depth of wound was  explored.  Wound was thoroughly irrigated.  Repair was performed, as per procedure above.  Tetanus was updated.  Patient was prescribed prophylactic  antibiotics.  He was advised to have sutures removed in 7 to 10 days and to return to the ED for any signs of infection.  He was discharged in good condition.        Final Clinical Impression(s) / ED Diagnoses Final diagnoses:  Laceration of right ring finger without foreign body without damage to nail, initial encounter    Rx / DC Orders ED Discharge Orders          Ordered    cephALEXin (KEFLEX) 500 MG capsule  3 times daily        04/15/22 0937              Gloris Manchesterixon, Saraya Tirey, MD 04/15/22 [phone]

## 2022-04-15 NOTE — ED Triage Notes (Signed)
Laceration to right ring finger from fence - injured last night,. Bleediung stopped at present. Full sensation to tip of finger.

## 2022-04-15 NOTE — Discharge Instructions (Addendum)
You have 5 sutures placed in your right ring finger.  These will need to be removed in 7 to 10 days.  There is a prescription of antibiotics sent to the [facility] on [address].  This is to prevent infection.  If area does show any evidence of increased warmth, redness, swelling, pain, or pus, please return to the emergency department.

## 2022-04-15 NOTE — ED Notes (Signed)
Pt is snoring, falls asleep.

## 2022-04-18 ENCOUNTER — Other Ambulatory Visit (HOSPITAL_COMMUNITY): Payer: Self-pay

## 2022-04-26 ENCOUNTER — Other Ambulatory Visit (HOSPITAL_COMMUNITY): Payer: Self-pay

## 2022-04-28 ENCOUNTER — Telehealth: Payer: Self-pay

## 2022-04-28 ENCOUNTER — Other Ambulatory Visit (HOSPITAL_COMMUNITY): Payer: Self-pay

## 2022-04-28 NOTE — Telephone Encounter (Signed)
Patient called with question about refills for his hepatitis C medication; stated he had enough medication through Sunday, with appointment scheduled Monday 6/12. Spoke with [facility] clinical pharmacist, and assured patient that he would receive refills at his appointment on Monday. Verbalized understanding, no additional questions.  Wyvonne Lenz, RN

## 2022-05-01 ENCOUNTER — Other Ambulatory Visit: Payer: Self-pay

## 2022-05-01 ENCOUNTER — Telehealth: Payer: Self-pay

## 2022-05-01 ENCOUNTER — Ambulatory Visit (INDEPENDENT_AMBULATORY_CARE_PROVIDER_SITE_OTHER): Payer: Medicare HMO | Admitting: Pharmacist

## 2022-05-01 DIAGNOSIS — B182 Chronic viral hepatitis C: Secondary | ICD-10-CM | POA: Diagnosis not present

## 2022-05-01 NOTE — Telephone Encounter (Signed)
[facility] Patient Advocate Encounter  Patient's medications have been couriered to [facility] from [facility] and will be picked up 05/01/22.  2nd box of Epclusa  Clearance Coots , CPhT Specialty Pharmacy Patient [facility] Phone: [phone] Fax:  [phone]

## 2022-05-01 NOTE — Progress Notes (Signed)
05/01/2022  HPI: Stephen Lyons is a 66 y.o. male who presents to the [facility] pharmacy clinic for Hepatitis C follow-up.  Medication: Epclusa x 12 weeks  Start Date: 04/05/22  Hepatitis C Genotype: 1a  Fibrosis Score: F1/F2  Patient Active Problem List   Diagnosis Date Noted   Liver mass 07/29/2015   Chronic hepatitis C without hepatic coma (HCC) 06/30/2015   Substance abuse (HCC) 06/30/2015    Patient's Medications  New Prescriptions   No medications on file  Previous Medications   EPCLUSA 400-100 MG TABS    Take 1 tablet by mouth daily.  Modified Medications   No medications on file  Discontinued Medications   No medications on file    Allergies: Allergies  Allergen Reactions   Aspirin Anaphylaxis    Past Medical History: Past Medical History:  Diagnosis Date   COPD (chronic obstructive pulmonary disease) (HCC)    Hepatitis C    Polysubstance abuse (HCC)     Social History: Social History   Socioeconomic History   Marital status: Divorced    Spouse name: Not on file   Number of children: Not on file   Years of education: Not on file   Highest education level: Not on file  Occupational History   Not on file  Tobacco Use   Smoking status: Every Day    Packs/day: 0.00    Types: Cigarettes    Start date: 11/20/1968   Smokeless tobacco: Never  Substance and Sexual Activity   Alcohol use: Yes    Alcohol/week: 0.0 standard drinks of alcohol    Comment: socially   Drug use: Yes    Types: Marijuana, Cocaine    Comment: daily, needs SA   Sexual activity: Yes    Partners: Female, Male  Other Topics Concern   Not on file  Social History Narrative   Not on file   Social Determinants of Health   Financial Resource Strain: Not on file  Food Insecurity: Not on file  Transportation Needs: Not on file  Physical Activity: Not on file  Stress: Not on file  Social Connections: Not on file    Labs: Hepatitis C Lab Results  Component Value Date    HCVGENOTYPE 1a 03/09/2022   FIBROSTAGE F1-F2 03/09/2022   Hepatitis B Lab Results  Component Value Date   HEPBSAB NEG 05/26/2015   HEPBSAG NEGATIVE 05/26/2015   Hepatitis A No results found for: "HAV" HIV Lab Results  Component Value Date   HIV NONREACTIVE 05/26/2015   Lab Results  Component Value Date   CREATININE 1.27 (H) 04/07/2018   CREATININE 1.08 08/17/2017   CREATININE 0.79 09/18/2016   CREATININE 0.95 08/17/2015   CREATININE 1.07 08/05/2015   Lab Results  Component Value Date   AST 19 03/09/2022   AST 32 04/07/2018   AST 20 09/18/2016   ALT 24 03/09/2022   ALT 23 03/09/2022   ALT 23 04/07/2018   INR 1.0 03/09/2022   INR 0.99 05/26/2015    Assessment: Stephen Lyons is here today to follow up for his Hepatitis C infection. He is prescribed Epclusa for 12 weeks. He missed one dose this past weekend but otherwise states that he takes it every day. Does not take it at consistent times but does take daily. No issues except for fatigue. I gave him his 2nd box today. He will come and pick up his 3rd and final box next month. The earliest [facility] could have it here was July  6th so will make an appointment for him on July 7th. Will check a Hep C viral load today as well.  Plan: - Continue Epclusa for 12 weeks - Hep C RNA today - F/u on 7/7 to pick up 3rd box and check in  Derek Laughter L. Arav Bannister, PharmD, BCIDP, AAHIVP, CPP Clinical Pharmacist Practitioner Infectious Diseases Clinical Pharmacist [facility] 05/01/2022, 10:08 AM

## 2022-05-04 LAB — HEPATITIS C RNA QUANTITATIVE
HCV Quantitative Log: 1.47 log IU/mL — ABNORMAL HIGH
HCV RNA, PCR, QN: 30 IU/mL — ABNORMAL HIGH

## 2022-05-16 ENCOUNTER — Other Ambulatory Visit (HOSPITAL_COMMUNITY): Payer: Self-pay

## 2022-05-19 ENCOUNTER — Other Ambulatory Visit (HOSPITAL_COMMUNITY): Payer: Self-pay

## 2022-05-24 ENCOUNTER — Telehealth: Payer: Self-pay

## 2022-05-24 NOTE — Telephone Encounter (Signed)
[facility] Patient Advocate Encounter  Patient's medications have been couriered to [facility] from [facility] and will be picked up 05/26/22.  3rd box of Epclusa  Clearance Coots , CPhT Specialty Pharmacy Patient [facility] Phone: [phone] Fax:  [phone]

## 2022-05-26 ENCOUNTER — Other Ambulatory Visit: Payer: Self-pay

## 2022-05-26 ENCOUNTER — Ambulatory Visit (INDEPENDENT_AMBULATORY_CARE_PROVIDER_SITE_OTHER): Payer: Medicare HMO | Admitting: Pharmacist

## 2022-05-26 DIAGNOSIS — B182 Chronic viral hepatitis C: Secondary | ICD-10-CM

## 2022-05-26 NOTE — Progress Notes (Signed)
HPI: Stephen Lyons is a 66 y.o. male who presents to the [facility] pharmacy clinic for Hepatitis C follow-up.  Medication: Epclusa x 12 weeks  Start Date: 04/05/22  Hepatitis C Genotype: 1a  Fibrosis Score: F1/F2  Hepatitis C RNA: 544,451 (05/26/15), 30 (05/01/22)  Patient Active Problem List   Diagnosis Date Noted   Liver mass 07/29/2015   Chronic hepatitis C without hepatic coma (HCC) 06/30/2015   Substance abuse (HCC) 06/30/2015    Patient's Medications  New Prescriptions   No medications on file  Previous Medications   EPCLUSA 400-100 MG TABS    Take 1 tablet by mouth daily.  Modified Medications   No medications on file  Discontinued Medications   No medications on file    Allergies: Allergies  Allergen Reactions   Aspirin Anaphylaxis    Past Medical History: Past Medical History:  Diagnosis Date   COPD (chronic obstructive pulmonary disease) (HCC)    Hepatitis C    Polysubstance abuse (HCC)     Social History: Social History   Socioeconomic History   Marital status: Divorced    Spouse name: Not on file   Number of children: Not on file   Years of education: Not on file   Highest education level: Not on file  Occupational History   Not on file  Tobacco Use   Smoking status: Every Day    Packs/day: 0.00    Types: Cigarettes    Start date: 11/20/1968   Smokeless tobacco: Never  Substance and Sexual Activity   Alcohol use: Yes    Alcohol/week: 0.0 standard drinks of alcohol    Comment: socially   Drug use: Yes    Types: Marijuana, Cocaine    Comment: daily, needs SA   Sexual activity: Yes    Partners: Female, Male  Other Topics Concern   Not on file  Social History Narrative   Not on file   Social Determinants of Health   Financial Resource Strain: Not on file  Food Insecurity: Not on file  Transportation Needs: Not on file  Physical Activity: Not on file  Stress: Not on file  Social Connections: Not on file    Labs: Hepatitis C Lab  Results  Component Value Date   HCVGENOTYPE 1a 03/09/2022   HCVRNAPCRQN 30 (H) 05/01/2022   FIBROSTAGE F1-F2 03/09/2022   Hepatitis B Lab Results  Component Value Date   HEPBSAB NEG 05/26/2015   HEPBSAG NEGATIVE 05/26/2015   Hepatitis A No results found for: "HAV" HIV Lab Results  Component Value Date   HIV NONREACTIVE 05/26/2015   Lab Results  Component Value Date   CREATININE 1.27 (H) 04/07/2018   CREATININE 1.08 08/17/2017   CREATININE 0.79 09/18/2016   CREATININE 0.95 08/17/2015   CREATININE 1.07 08/05/2015   Lab Results  Component Value Date   AST 19 03/09/2022   AST 32 04/07/2018   AST 20 09/18/2016   ALT 24 03/09/2022   ALT 23 03/09/2022   ALT 23 04/07/2018   INR 1.0 03/09/2022   INR 0.99 05/26/2015    Assessment: Stephen Lyons presents to clinic today for HCV follow-up. He has been taking Epclusa for 8 weeks and is tolerating the medicine well other than some fatigue. Picked up his last bottle here today. He has not missed any doses since his last visit. Will check HCV RNA today and follow-up in 4 weeks with Stephen Lyons for his end of treatment visit.    Plan: Check HCV RNA Continue Epclusa x  4 weeks Follow-up with Stephen Lyons on 8/7   Stephen Lyons, PharmD, CPP Clinical Pharmacist Practitioner Infectious Diseases Clinical Pharmacist [facility] 05/26/2022, 11:22 AM

## 2022-05-28 LAB — HEPATITIS C RNA QUANTITATIVE
HCV Quantitative Log: <1.18 log IU/mL
HCV RNA, PCR, QN: <15 IU/mL

## 2022-06-12 ENCOUNTER — Other Ambulatory Visit (HOSPITAL_COMMUNITY): Payer: Self-pay

## 2022-06-14 ENCOUNTER — Other Ambulatory Visit (HOSPITAL_COMMUNITY): Payer: Self-pay

## 2022-06-26 ENCOUNTER — Ambulatory Visit: Payer: Medicare HMO | Admitting: Family

## 2022-07-12 ENCOUNTER — Ambulatory Visit: Payer: Medicare HMO | Admitting: Family

## 2022-07-14 ENCOUNTER — Ambulatory Visit: Payer: Medicare HMO | Admitting: Family

## 2022-07-17 ENCOUNTER — Ambulatory Visit: Payer: Medicare HMO | Admitting: Family

## 2023-05-17 ENCOUNTER — Other Ambulatory Visit (HOSPITAL_COMMUNITY): Payer: Self-pay | Admitting: Nurse Practitioner

## 2023-05-17 DIAGNOSIS — B182 Chronic viral hepatitis C: Secondary | ICD-10-CM

## 2023-05-29 ENCOUNTER — Ambulatory Visit (HOSPITAL_COMMUNITY): Payer: Medicare HMO

## 2023-05-29 ENCOUNTER — Encounter (HOSPITAL_COMMUNITY): Payer: Self-pay

## 2024-11-07 ENCOUNTER — Other Ambulatory Visit: Payer: Self-pay | Admitting: Nurse Practitioner

## 2024-11-07 DIAGNOSIS — K7400 Hepatic fibrosis, unspecified: Secondary | ICD-10-CM

## 2024-11-07 DIAGNOSIS — B1920 Unspecified viral hepatitis C without hepatic coma: Secondary | ICD-10-CM

## 2024-11-28 ENCOUNTER — Ambulatory Visit
Admission: RE | Admit: 2024-11-28 | Discharge: 2024-11-28 | Disposition: A | Source: Ambulatory Visit | Attending: Nurse Practitioner

## 2024-11-28 DIAGNOSIS — K7400 Hepatic fibrosis, unspecified: Secondary | ICD-10-CM

## 2024-11-28 DIAGNOSIS — B1920 Unspecified viral hepatitis C without hepatic coma: Secondary | ICD-10-CM
